# Patient Record
Sex: Male | Born: 1950 | Race: White | Hispanic: No | Marital: Single | Smoking: Never smoker
Health system: Southern US, Community
[De-identification: ages and names within clinical notes are randomized; demographics above are authoritative.]

## PROBLEM LIST (undated history)

## (undated) DIAGNOSIS — K5792 Diverticulitis of intestine, part unspecified, without perforation or abscess without bleeding: Secondary | ICD-10-CM

## (undated) DIAGNOSIS — N200 Calculus of kidney: Secondary | ICD-10-CM

## (undated) DIAGNOSIS — H269 Unspecified cataract: Secondary | ICD-10-CM

## (undated) DIAGNOSIS — J189 Pneumonia, unspecified organism: Secondary | ICD-10-CM

## (undated) DIAGNOSIS — M1990 Unspecified osteoarthritis, unspecified site: Secondary | ICD-10-CM

## (undated) DIAGNOSIS — J45909 Unspecified asthma, uncomplicated: Secondary | ICD-10-CM

## (undated) DIAGNOSIS — R011 Cardiac murmur, unspecified: Secondary | ICD-10-CM

## (undated) DIAGNOSIS — K219 Gastro-esophageal reflux disease without esophagitis: Secondary | ICD-10-CM

## (undated) DIAGNOSIS — F419 Anxiety disorder, unspecified: Secondary | ICD-10-CM

## (undated) HISTORY — PX: FOOT SURGERY: SHX648

## (undated) HISTORY — PX: BACK SURGERY: SHX140

## (undated) HISTORY — PX: CYSTOSCOPY W/ STONE MANIPULATION: SHX1427

## (undated) HISTORY — PX: CHOLECYSTECTOMY: SHX55

## (undated) HISTORY — DX: Cardiac murmur, unspecified: R01.1

## (undated) HISTORY — PX: OTHER SURGICAL HISTORY: SHX169

## (undated) HISTORY — DX: Unspecified cataract: H26.9

## (undated) HISTORY — PX: KNEE ARTHROSCOPY: SHX127

---

## 2015-12-07 ENCOUNTER — Other Ambulatory Visit: Payer: Self-pay | Admitting: Surgical

## 2016-01-04 NOTE — Patient Instructions (Signed)
Marcus Graves  01/04/2016   Your procedure is scheduled on: 01/12/16  Report to [facility] Main  Entrance take [place]  elevators to 3rd floor to  [place] at [place] AM.  Call this number if you have problems the morning of surgery [phone]   Remember: ONLY 1 PERSON MAY GO WITH YOU TO SHORT STAY TO GET  READY MORNING OF [place].  Do not eat food or drink liquids :After Midnight.     Take these medicines the morning of surgery with A SIP OF WATER: Levothyroxine, c elexa                                You may not have any metal on your body including hair pins and              piercings  Do not wear jewelry, make-up, lotions, powders or perfumes, deodorant             Do not wear nail polish.  Do not shave  48 hours prior to surgery.              Men may shave face and neck.   Do not bring valuables to the hospital. [place].  Contacts, dentures or bridgework may not be worn into surgery.  Leave suitcase in the car. After surgery it may be brought to your room.              [place] - Preparing for Surgery Before surgery, you can play an important role.  Because skin is not sterile, your skin needs to be as free of germs as possible.  You can reduce the number of germs on your skin by washing with CHG (chlorahexidine gluconate) soap before surgery.  CHG is an antiseptic cleaner which kills germs and bonds with the skin to continue killing germs even after washing. Please DO NOT use if you have an allergy to CHG or antibacterial soaps.  If your skin becomes reddened/irritated stop using the CHG and inform your nurse when you arrive at Short Stay. Do not shave (including legs and underarms) for at least 48 hours prior to the first CHG shower.  You may shave your face/neck. Please follow these instructions carefully:  1.  Shower with CHG Soap the night before surgery and the  morning of  Surgery.  2.  If you choose to wash your hair, wash your hair first as usual with your  normal  shampoo.  3.  After you shampoo, rinse your hair and body thoroughly to remove the  shampoo.                           4.  Use CHG as you would any other liquid soap.  You can apply chg directly  to the skin and wash                       Gently with a scrungie or clean washcloth.  5.  Apply the CHG Soap to your body ONLY FROM THE NECK DOWN.   Do not use on face/ open  Wound or open sores. Avoid contact with eyes, ears mouth and genitals (private parts).                       Wash face,  Genitals (private parts) with your normal soap.             6.  Wash thoroughly, paying special attention to the area where your surgery  will be performed.  7.  Thoroughly rinse your body with warm water from the neck down.  8.  DO NOT shower/wash with your normal soap after using and rinsing off  the CHG Soap.                9.  Pat yourself dry with a clean towel.            10.  Wear clean pajamas.            11.  Place clean sheets on your bed the night of your first shower and do not  sleep with pets. Day of Surgery : Do not apply any lotions/deodorants the morning of surgery.  Please wear clean clothes to the hospital/surgery center.  FAILURE TO FOLLOW THESE INSTRUCTIONS MAY RESULT IN THE CANCELLATION OF YOUR SURGERY PATIENT SIGNATURE_________________________________  NURSE SIGNATURE__________________________________  ________________________________________________________________________  WHAT IS A BLOOD TRANSFUSION? Blood Transfusion Information  A transfusion is the replacement of blood or some of its parts. Blood is made up of multiple cells which provide different functions.  Red blood cells carry oxygen and are used for blood loss replacement.  White blood cells fight against infection.  Platelets control bleeding.  Plasma helps clot blood.  Other blood products are  available for specialized needs, such as hemophilia or other clotting disorders. BEFORE THE TRANSFUSION  Who gives blood for transfusions?   Healthy volunteers who are fully evaluated to make sure their blood is safe. This is blood bank blood. Transfusion therapy is the safest it has ever been in the practice of medicine. Before blood is taken from a donor, a complete history is taken to make sure that person has no history of diseases nor engages in risky social behavior (examples are intravenous drug use or sexual activity with multiple partners). The donor's travel history is screened to minimize risk of transmitting infections, such as malaria. The donated blood is tested for signs of infectious diseases, such as HIV and hepatitis. The blood is then tested to be sure it is compatible with you in order to minimize the chance of a transfusion reaction. If you or a relative donates blood, this is often done in anticipation of surgery and is not appropriate for emergency situations. It takes many days to process the donated blood. RISKS AND COMPLICATIONS Although transfusion therapy is very safe and saves many lives, the main dangers of transfusion include:   Getting an infectious disease.  Developing a transfusion reaction. This is an allergic reaction to something in the blood you were given. Every precaution is taken to prevent this. The decision to have a blood transfusion has been considered carefully by your caregiver before blood is given. Blood is not given unless the benefits outweigh the risks. AFTER THE TRANSFUSION  Right after receiving a blood transfusion, you will usually feel much better and more energetic. This is especially true if your red blood cells have gotten low (anemic). The transfusion raises the level of the red blood cells which carry oxygen, and this usually causes an energy increase.  The  nurse administering the transfusion will monitor you carefully for  complications. HOME CARE INSTRUCTIONS  No special instructions are needed after a transfusion. You may find your energy is better. Speak with your caregiver about any limitations on activity for underlying diseases you may have. SEEK MEDICAL CARE IF:   Your condition is not improving after your transfusion.  You develop redness or irritation at the intravenous (IV) site. SEEK IMMEDIATE MEDICAL CARE IF:  Any of the following symptoms occur over the next 12 hours:  Shaking chills.  You have a temperature by mouth above 102 F (38.9 C), not controlled by medicine.  Chest, back, or muscle pain.  People around you feel you are not acting correctly or are confused.  Shortness of breath or difficulty breathing.  Dizziness and fainting.  You get a rash or develop hives.  You have a decrease in urine output.  Your urine turns a dark color or changes to pink, red, or brown. Any of the following symptoms occur over the next 10 days:  You have a temperature by mouth above 102 F (38.9 C), not controlled by medicine.  Shortness of breath.  Weakness after normal activity.  The white part of the eye turns yellow (jaundice).  You have a decrease in the amount of urine or are urinating less often.  Your urine turns a dark color or changes to pink, red, or brown. Document Released: 04/14/2000 Document Revised: 07/10/2011 Document Reviewed: 12/02/2007 ExitCare Patient Information 2014 [place].  _______________________________________________________________________  Incentive Spirometer  An incentive spirometer is a tool that can help keep your lungs clear and active. This tool measures how well you are filling your lungs with each breath. Taking long deep breaths may help reverse or decrease the chance of developing breathing (pulmonary) problems (especially infection) following:  A long period of time when you are unable to move or be active. BEFORE THE PROCEDURE   If  the spirometer includes an indicator to show your best effort, your nurse or respiratory therapist will set it to a desired goal.  If possible, sit up straight or lean slightly forward. Try not to slouch.  Hold the incentive spirometer in an upright position. INSTRUCTIONS FOR USE  1. Sit on the edge of your bed if possible, or sit up as far as you can in bed or on a chair. 2. Hold the incentive spirometer in an upright position. 3. Breathe out normally. 4. Place the mouthpiece in your mouth and seal your lips tightly around it. 5. Breathe in slowly and as deeply as possible, raising the piston or the ball toward the top of the column. 6. Hold your breath for 3-5 seconds or for as long as possible. Allow the piston or ball to fall to the bottom of the column. 7. Remove the mouthpiece from your mouth and breathe out normally. 8. Rest for a few seconds and repeat Steps 1 through 7 at least 10 times every 1-2 hours when you are awake. Take your time and take a few normal breaths between deep breaths. 9. The spirometer may include an indicator to show your best effort. Use the indicator as a goal to work toward during each repetition. 10. After each set of 10 deep breaths, practice coughing to be sure your lungs are clear. If you have an incision (the cut made at the time of surgery), support your incision when coughing by placing a pillow or rolled up towels firmly against it. Once you are able to get  out of bed, walk around indoors and cough well. You may stop using the incentive spirometer when instructed by your caregiver.  RISKS AND COMPLICATIONS  Take your time so you do not get dizzy or light-headed.  If you are in pain, you may need to take or ask for pain medication before doing incentive spirometry. It is harder to take a deep breath if you are having pain. AFTER USE  Rest and breathe slowly and easily.  It can be helpful to keep track of a log of your progress. Your caregiver can  provide you with a simple table to help with this. If you are using the spirometer at home, follow these instructions: [place] IF:   You are having difficultly using the spirometer.  You have trouble using the spirometer as often as instructed.  Your pain medication is not giving enough relief while using the spirometer.  You develop fever of 100.5 F (38.1 C) or higher. SEEK IMMEDIATE MEDICAL CARE IF:   You cough up bloody sputum that had not been present before.  You develop fever of 102 F (38.9 C) or greater.  You develop worsening pain at or near the incision site. MAKE SURE YOU:   Understand these instructions.  Will watch your condition.  Will get help right away if you are not doing well or get worse. Document Released: 08/28/2006 Document Revised: 07/10/2011 Document Reviewed: 10/29/2006 [facility] Patient Information 2014 [place], [place].   ________________________________________________________________________

## 2016-01-05 ENCOUNTER — Encounter (HOSPITAL_COMMUNITY)
Admission: RE | Admit: 2016-01-05 | Discharge: 2016-01-05 | Disposition: A | Discharge status: Home / Self Care | Payer: Medicare Other | Source: Ambulatory Visit | Attending: Orthopedic Surgery | Admitting: Orthopedic Surgery

## 2016-01-05 ENCOUNTER — Encounter (HOSPITAL_COMMUNITY): Payer: Self-pay

## 2016-01-05 ENCOUNTER — Ambulatory Visit (HOSPITAL_COMMUNITY)
Admission: RE | Admit: 2016-01-05 | Discharge: 2016-01-05 | Disposition: A | Discharge status: Home / Self Care | Payer: Medicare Other | Source: Ambulatory Visit | Attending: Surgical | Admitting: Surgical

## 2016-01-05 DIAGNOSIS — Z0189 Encounter for other specified special examinations: Secondary | ICD-10-CM

## 2016-01-05 DIAGNOSIS — I700 Atherosclerosis of aorta: Secondary | ICD-10-CM | POA: Diagnosis not present

## 2016-01-05 DIAGNOSIS — Z01818 Encounter for other preprocedural examination: Secondary | ICD-10-CM | POA: Insufficient documentation

## 2016-01-05 DIAGNOSIS — IMO0001 Reserved for inherently not codable concepts without codable children: Secondary | ICD-10-CM

## 2016-01-05 HISTORY — DX: Diverticulitis of intestine, part unspecified, without perforation or abscess without bleeding: K57.92

## 2016-01-05 HISTORY — DX: Unspecified asthma, uncomplicated: J45.909

## 2016-01-05 HISTORY — DX: Gastro-esophageal reflux disease without esophagitis: K21.9

## 2016-01-05 HISTORY — DX: Unspecified osteoarthritis, unspecified site: M19.90

## 2016-01-05 HISTORY — DX: Calculus of kidney: N20.0

## 2016-01-05 HISTORY — DX: Pneumonia, unspecified organism: J18.9

## 2016-01-05 HISTORY — DX: Anxiety disorder, unspecified: F41.9

## 2016-01-05 LAB — COMPREHENSIVE METABOLIC PANEL
ALT: 47 U/L (ref 17–63)
AST: 42 U/L — ABNORMAL HIGH (ref 15–41)
Albumin: 4.3 g/dL (ref 3.5–5.0)
Alkaline Phosphatase: 46 U/L (ref 38–126)
Anion gap: 7 (ref 5–15)
BUN: 13 mg/dL (ref 6–20)
CO2: 28 mmol/L (ref 22–32)
Calcium: 9.1 mg/dL (ref 8.9–10.3)
Chloride: 105 mmol/L (ref 101–111)
Creatinine, Ser: 0.88 mg/dL (ref 0.61–1.24)
GFR calc Af Amer: >60 mL/min (ref >60)
GFR calc non Af Amer: >60 mL/min (ref >60)
Glucose, Bld: 98 mg/dL (ref 65–99)
Potassium: 3.9 mmol/L (ref 3.5–5.1)
Sodium: 140 mmol/L (ref 135–145)
Total Bilirubin: 1.1 mg/dL (ref 0.3–1.2)
Total Protein: 7.2 g/dL (ref 6.5–8.1)

## 2016-01-05 LAB — SURGICAL PCR SCREEN
MRSA, PCR: NEGATIVE
STAPHYLOCOCCUS AUREUS: POSITIVE — AB

## 2016-01-05 LAB — URINALYSIS, ROUTINE W REFLEX MICROSCOPIC
Bilirubin Urine: NEGATIVE
Glucose, UA: NEGATIVE mg/dL
Hgb urine dipstick: NEGATIVE
Ketones, ur: NEGATIVE mg/dL
Leukocytes, UA: NEGATIVE
Nitrite: NEGATIVE
Protein, ur: NEGATIVE mg/dL
Specific Gravity, Urine: 1.024 (ref 1.005–1.030)
pH: 6 (ref 5.0–8.0)

## 2016-01-05 LAB — CBC WITH DIFFERENTIAL/PLATELET
Basophils Absolute: 0 10*3/uL (ref 0.0–0.1)
Basophils Relative: 1 %
Eosinophils Absolute: 0.2 10*3/uL (ref 0.0–0.7)
Eosinophils Relative: 4 %
HCT: 43 % (ref 39.0–52.0)
Hemoglobin: 15 g/dL (ref 13.0–17.0)
Lymphocytes Relative: 23 %
Lymphs Abs: 1.1 10*3/uL (ref 0.7–4.0)
MCH: 32.6 pg (ref 26.0–34.0)
MCHC: 34.9 g/dL (ref 30.0–36.0)
MCV: 93.5 fL (ref 78.0–100.0)
Monocytes Absolute: 0.5 10*3/uL (ref 0.1–1.0)
Monocytes Relative: 10 %
Neutro Abs: 3.1 10*3/uL (ref 1.7–7.7)
Neutrophils Relative %: 62 %
Platelets: 206 10*3/uL (ref 150–400)
RBC: 4.6 MIL/uL (ref 4.22–5.81)
RDW: 12.6 % (ref 11.5–15.5)
WBC: 4.9 10*3/uL (ref 4.0–10.5)

## 2016-01-05 LAB — APTT: aPTT: 32 seconds (ref 24–36)

## 2016-01-05 LAB — ABO/RH: ABO/RH(D): A POS

## 2016-01-05 LAB — PROTIME-INR
INR: 1.04
Prothrombin Time: 13.6 seconds (ref 11.4–15.2)

## 2016-01-10 NOTE — H&P (Signed)
TOTAL KNEE ADMISSION H&P  Patient is being admitted for right total knee arthroplasty.  Subjective:  Chief Complaint:right knee pain.  HPI: Marcus Graves, 65 y.o. male, has a history of pain and functional disability in the right knee due to arthritis and has failed non-surgical conservative treatments for greater than 12 weeks to includeNSAID's and/or analgesics, corticosteriod injections, flexibility and strengthening excercises and activity modification.  Onset of symptoms was gradual, starting 8 years ago with gradually worsening course since that time. The patient noted prior procedures on the knee to include  arthroscopy and menisectomy on the right knee(s).  Patient currently rates pain in the right knee(s) at 8 out of 10 with activity. Patient has night pain, worsening of pain with activity and weight bearing, pain that interferes with activities of daily living, pain with passive range of motion, crepitus and joint swelling.  Patient has evidence of periarticular osteophytes and joint space narrowing by imaging studies. There is no active infection.   Past Medical History:  Diagnosis Date  . Anxiety   . Arthritis   . Asthma   . Diverticulitis   . GERD (gastroesophageal reflux disease)   . Kidney stone   . Pneumonia     Past Surgical History:  Procedure Laterality Date  . BACK SURGERY     lumbar  . CHOLECYSTECTOMY    . CYSTOSCOPY W/ STONE MANIPULATION    . esophageal dilitation    . FOOT SURGERY Right   . KNEE ARTHROSCOPY     2 right, 1 left      Current Outpatient Prescriptions:  .  albuterol (PROVENTIL HFA;VENTOLIN HFA) 108 (90 Base) MCG/ACT inhaler, Inhale 2 puffs into the lungs every 4 (four) hours as needed for wheezing or shortness of breath. , Disp: , Rfl:  .  ALPRAZolam (XANAX) 0.5 MG tablet, Take 0.5 mg by mouth at bedtime., Disp: , Rfl:  .  aspirin 325 MG tablet, Take 325 mg by mouth daily., Disp: , Rfl:  .  citalopram (CELEXA) 10 MG tablet, Take 10 mg by  mouth daily., Disp: , Rfl:  .  levothyroxine (SYNTHROID, LEVOTHROID) 50 MCG tablet, Take 50 mcg by mouth daily before breakfast. , Disp: , Rfl: 0  No Known Allergies  Social History  Substance Use Topics  . Smoking status: Never Smoker  . Smokeless tobacco: Current User    Types: Chew  . Alcohol use No      Review of Systems  Constitutional: Negative.   HENT: Positive for tinnitus. Negative for congestion, ear discharge, ear pain, hearing loss, nosebleeds and sore throat.   Eyes: Negative.   Respiratory: Positive for cough. Negative for hemoptysis, sputum production, shortness of breath, wheezing and stridor.   Cardiovascular: Negative.   Gastrointestinal: Negative.   Genitourinary: Negative.   Musculoskeletal: Positive for joint pain and myalgias. Negative for back pain, falls and neck pain.  Skin: Negative.   Neurological: Negative.  Negative for headaches.  Endo/Heme/Allergies: Negative.   Psychiatric/Behavioral: Negative.     Objective:  Physical Exam  Constitutional: He is oriented to person, place, and time. He appears well-developed. No distress.  Obese  HENT:  Head: Normocephalic and atraumatic.  Right Ear: External ear normal.  Left Ear: External ear normal.  Nose: Nose normal.  Mouth/Throat: Oropharynx is clear and moist.  Eyes: Conjunctivae and EOM are normal.  Neck: Normal range of motion. Neck supple.  Cardiovascular: Normal rate, regular rhythm and intact distal pulses.   Murmur heard.  Systolic murmur is present  with a grade of 2/6  Respiratory: Effort normal and breath sounds normal. No respiratory distress. He has no wheezes.  GI: Soft. Bowel sounds are normal. He exhibits no distension. There is no tenderness.  Musculoskeletal:       Right hip: Normal.       Left hip: Normal.       Right knee: He exhibits decreased range of motion and swelling. He exhibits no effusion and no erythema. Tenderness found. Medial joint line and lateral joint line  tenderness noted.       Left knee: Normal.  Neurological: He is alert and oriented to person, place, and time. He has normal strength. No sensory deficit.  Skin: No rash noted. He is not diaphoretic. No erythema.  Psychiatric: He has a normal mood and affect. His behavior is normal.    Vitals Weight: 260 lb Height: 72in Body Surface Area: 2.38 m Body Mass Index: 35.26 kg/m  Pulse: 72 (Regular)  BP: 140/76 (Sitting, Left Arm, Standard)  Imaging Review Plain radiographs demonstrate severe degenerative joint disease of the right knee(s). The overall alignment ismild varus. The bone quality appears to be good for age and reported activity level.  Assessment/Plan:  End stage primary osteoarthritis, right knee   The patient history, physical examination, clinical judgment of the provider and imaging studies are consistent with end stage degenerative joint disease of the right knee(s) and total knee arthroplasty is deemed medically necessary. The treatment options including medical management, injection therapy arthroscopy and arthroplasty were discussed at length. The risks and benefits of total knee arthroplasty were presented and reviewed. The risks due to aseptic loosening, infection, stiffness, patella tracking problems, thromboembolic complications and other imponderables were discussed. The patient acknowledged the explanation, agreed to proceed with the plan and consent was signed. Patient is being admitted for inpatient treatment for surgery, pain control, PT, OT, prophylactic antibiotics, VTE prophylaxis, progressive ambulation and ADL's and discharge planning. The patient is planning to be discharged home.  PCP: Dr. Cecille Amsterdam Therapy Plans: directly to outpatient at [facility] starting 9/18 Home with daughter and son  Ardeen Jourdain, [place]

## 2016-01-11 MED ORDER — DEXTROSE 5 % IV SOLN
3.0000 g | INTRAVENOUS | Status: AC
  Administered 2016-01-12: 3 g via INTRAVENOUS
  Filled 2016-01-11 (×2): qty 3000

## 2016-01-12 ENCOUNTER — Encounter (HOSPITAL_COMMUNITY)
Admission: RE | Disposition: A | Discharge status: Home Health | Payer: Self-pay | Source: Ambulatory Visit | Attending: Orthopedic Surgery

## 2016-01-12 ENCOUNTER — Inpatient Hospital Stay (HOSPITAL_COMMUNITY): Payer: Medicare Other | Admitting: Anesthesiology

## 2016-01-12 ENCOUNTER — Encounter (HOSPITAL_COMMUNITY): Payer: Self-pay | Admitting: *Deleted

## 2016-01-12 ENCOUNTER — Inpatient Hospital Stay (HOSPITAL_COMMUNITY)
Admission: RE | Admit: 2016-01-12 | Discharge: 2016-01-14 | DRG: 470 | Disposition: A | Discharge status: Home Health | Payer: Medicare Other | Source: Ambulatory Visit | Attending: Orthopedic Surgery | Admitting: Orthopedic Surgery

## 2016-01-12 DIAGNOSIS — M25561 Pain in right knee: Secondary | ICD-10-CM | POA: Diagnosis present

## 2016-01-12 DIAGNOSIS — J45909 Unspecified asthma, uncomplicated: Secondary | ICD-10-CM | POA: Diagnosis present

## 2016-01-12 DIAGNOSIS — F419 Anxiety disorder, unspecified: Secondary | ICD-10-CM | POA: Diagnosis present

## 2016-01-12 DIAGNOSIS — K219 Gastro-esophageal reflux disease without esophagitis: Secondary | ICD-10-CM | POA: Diagnosis present

## 2016-01-12 DIAGNOSIS — Z79899 Other long term (current) drug therapy: Secondary | ICD-10-CM

## 2016-01-12 DIAGNOSIS — Z96659 Presence of unspecified artificial knee joint: Secondary | ICD-10-CM

## 2016-01-12 DIAGNOSIS — M1711 Unilateral primary osteoarthritis, right knee: Secondary | ICD-10-CM | POA: Diagnosis present

## 2016-01-12 HISTORY — PX: TOTAL KNEE ARTHROPLASTY: SHX125

## 2016-01-12 LAB — TYPE AND SCREEN
ABO/RH(D): A POS
Antibody Screen: NEGATIVE

## 2016-01-12 SURGERY — ARTHROPLASTY, KNEE, TOTAL
Anesthesia: General | Site: Knee | Laterality: Right

## 2016-01-12 MED ORDER — BUPIVACAINE LIPOSOME 1.3 % IJ SUSP
INTRAMUSCULAR | Status: DC | PRN
  Administered 2016-01-12: 20 mL

## 2016-01-12 MED ORDER — BUPIVACAINE LIPOSOME 1.3 % IJ SUSP
20.0000 mL | Freq: Once | INTRAMUSCULAR | Status: DC
  Filled 2016-01-12: qty 20

## 2016-01-12 MED ORDER — ONDANSETRON HCL 4 MG/2ML IJ SOLN: 4.0000 mg | Freq: Four times a day (QID) | INTRAMUSCULAR | Status: DC | PRN

## 2016-01-12 MED ORDER — SUCCINYLCHOLINE CHLORIDE 200 MG/10ML IV SOSY
PREFILLED_SYRINGE | INTRAVENOUS | Status: DC | PRN
  Administered 2016-01-12: 140 mg via INTRAVENOUS

## 2016-01-12 MED ORDER — CHLORHEXIDINE GLUCONATE 4 % EX LIQD
60.0000 mL | Freq: Once | CUTANEOUS | Status: DC
Start: 2016-01-12 — End: 2016-01-12

## 2016-01-12 MED ORDER — POLYMYXIN B SULFATE 500000 UNITS IJ SOLR
INTRAMUSCULAR | Status: AC
  Filled 2016-01-12: qty 1

## 2016-01-12 MED ORDER — HYDROCODONE-ACETAMINOPHEN 5-325 MG PO TABS
ORAL_TABLET | ORAL | Status: AC
  Filled 2016-01-12: qty 1

## 2016-01-12 MED ORDER — TRANEXAMIC ACID 1000 MG/10ML IV SOLN
1000.0000 mg | INTRAVENOUS | Status: AC
  Administered 2016-01-12: 1000 mg via INTRAVENOUS
  Filled 2016-01-12: qty 1100

## 2016-01-12 MED ORDER — POLYETHYLENE GLYCOL 3350 17 G PO PACK: 17.0000 g | PACK | Freq: Every day | ORAL | Status: DC | PRN

## 2016-01-12 MED ORDER — HYDROMORPHONE HCL 1 MG/ML IJ SOLN
INTRAMUSCULAR | Status: AC
  Filled 2016-01-12: qty 1

## 2016-01-12 MED ORDER — LIDOCAINE 2% (20 MG/ML) 5 ML SYRINGE
INTRAMUSCULAR | Status: AC
  Filled 2016-01-12: qty 5

## 2016-01-12 MED ORDER — LACTATED RINGERS IV SOLN
INTRAVENOUS | Status: DC
  Administered 2016-01-12 (×2): via INTRAVENOUS

## 2016-01-12 MED ORDER — RIVAROXABAN 10 MG PO TABS
10.0000 mg | ORAL_TABLET | Freq: Every day | ORAL | Status: DC
  Administered 2016-01-13 – 2016-01-14 (×2): 10 mg via ORAL
  Filled 2016-01-12 (×2): qty 1

## 2016-01-12 MED ORDER — STERILE WATER FOR IRRIGATION IR SOLN
Status: DC | PRN
  Administered 2016-01-12: 2000 mL

## 2016-01-12 MED ORDER — CELECOXIB 200 MG PO CAPS
200.0000 mg | ORAL_CAPSULE | Freq: Two times a day (BID) | ORAL | Status: DC
  Administered 2016-01-12 – 2016-01-14 (×4): 200 mg via ORAL
  Filled 2016-01-12 (×4): qty 1

## 2016-01-12 MED ORDER — SODIUM CHLORIDE 0.9 % IJ SOLN
INTRAMUSCULAR | Status: DC | PRN
  Administered 2016-01-12: 20 mL

## 2016-01-12 MED ORDER — METHOCARBAMOL 1000 MG/10ML IJ SOLN
500.0000 mg | Freq: Four times a day (QID) | INTRAVENOUS | Status: DC | PRN
  Administered 2016-01-12: 500 mg via INTRAVENOUS
  Filled 2016-01-12: qty 550
  Filled 2016-01-12: qty 5

## 2016-01-12 MED ORDER — SODIUM CHLORIDE 0.9 % IJ SOLN
INTRAMUSCULAR | Status: AC
  Filled 2016-01-12: qty 50

## 2016-01-12 MED ORDER — CITALOPRAM HYDROBROMIDE 20 MG PO TABS
10.0000 mg | ORAL_TABLET | Freq: Every day | ORAL | Status: DC
  Administered 2016-01-13 – 2016-01-14 (×2): 10 mg via ORAL
  Filled 2016-01-12 (×2): qty 1

## 2016-01-12 MED ORDER — BISACODYL 5 MG PO TBEC: 5.0000 mg | DELAYED_RELEASE_TABLET | Freq: Every day | ORAL | Status: DC | PRN

## 2016-01-12 MED ORDER — SUGAMMADEX SODIUM 500 MG/5ML IV SOLN
INTRAVENOUS | Status: DC | PRN
  Administered 2016-01-12: 400 mg via INTRAVENOUS

## 2016-01-12 MED ORDER — ACETAMINOPHEN 650 MG RE SUPP: 650.0000 mg | Freq: Four times a day (QID) | RECTAL | Status: DC | PRN

## 2016-01-12 MED ORDER — ONDANSETRON HCL 4 MG/2ML IJ SOLN: 4.0000 mg | Freq: Once | INTRAMUSCULAR | Status: DC | PRN

## 2016-01-12 MED ORDER — PROPOFOL 10 MG/ML IV BOLUS
INTRAVENOUS | Status: AC
  Filled 2016-01-12: qty 20

## 2016-01-12 MED ORDER — FENTANYL CITRATE (PF) 100 MCG/2ML IJ SOLN
INTRAMUSCULAR | Status: AC
  Filled 2016-01-12: qty 2

## 2016-01-12 MED ORDER — FLEET ENEMA 7-19 GM/118ML RE ENEM: 1.0000 | ENEMA | Freq: Once | RECTAL | Status: DC | PRN

## 2016-01-12 MED ORDER — BUPIVACAINE-EPINEPHRINE (PF) 0.5% -1:200000 IJ SOLN
INTRAMUSCULAR | Status: DC | PRN
  Administered 2016-01-12: 30 mL via PERINEURAL

## 2016-01-12 MED ORDER — MENTHOL 3 MG MT LOZG: 1.0000 | LOZENGE | OROMUCOSAL | Status: DC | PRN

## 2016-01-12 MED ORDER — LIDOCAINE 2% (20 MG/ML) 5 ML SYRINGE
INTRAMUSCULAR | Status: DC | PRN
  Administered 2016-01-12: 100 mg via INTRAVENOUS

## 2016-01-12 MED ORDER — CEFAZOLIN IN D5W 1 GM/50ML IV SOLN
1.0000 g | Freq: Four times a day (QID) | INTRAVENOUS | Status: AC
  Administered 2016-01-12 (×2): 1 g via INTRAVENOUS
  Filled 2016-01-12 (×4): qty 50

## 2016-01-12 MED ORDER — FENTANYL CITRATE (PF) 100 MCG/2ML IJ SOLN
INTRAMUSCULAR | Status: DC | PRN
  Administered 2016-01-12: 100 ug via INTRAVENOUS
  Administered 2016-01-12: 50 ug via INTRAVENOUS

## 2016-01-12 MED ORDER — BUPIVACAINE HCL (PF) 0.25 % IJ SOLN
INTRAMUSCULAR | Status: AC
  Filled 2016-01-12: qty 30

## 2016-01-12 MED ORDER — CHLORHEXIDINE GLUCONATE 4 % EX LIQD: 60.0000 mL | Freq: Once | CUTANEOUS | Status: DC

## 2016-01-12 MED ORDER — SODIUM CHLORIDE 0.9 % IR SOLN
Status: DC | PRN
  Administered 2016-01-12: 1000 mL

## 2016-01-12 MED ORDER — ROCURONIUM BROMIDE 10 MG/ML (PF) SYRINGE
PREFILLED_SYRINGE | INTRAVENOUS | Status: DC | PRN
  Administered 2016-01-12: 20 mg via INTRAVENOUS
  Administered 2016-01-12: 40 mg via INTRAVENOUS
  Administered 2016-01-12: 20 mg via INTRAVENOUS

## 2016-01-12 MED ORDER — MIDAZOLAM HCL 2 MG/2ML IJ SOLN
INTRAMUSCULAR | Status: AC
  Filled 2016-01-12: qty 2

## 2016-01-12 MED ORDER — LEVOTHYROXINE SODIUM 50 MCG PO TABS
50.0000 ug | ORAL_TABLET | Freq: Every day | ORAL | Status: DC
  Administered 2016-01-13 – 2016-01-14 (×2): 50 ug via ORAL
  Filled 2016-01-12 (×2): qty 1

## 2016-01-12 MED ORDER — FERROUS SULFATE 325 (65 FE) MG PO TABS
325.0000 mg | ORAL_TABLET | Freq: Three times a day (TID) | ORAL | Status: DC
  Administered 2016-01-13 – 2016-01-14 (×2): 325 mg via ORAL
  Filled 2016-01-12 (×2): qty 1

## 2016-01-12 MED ORDER — ONDANSETRON HCL 4 MG PO TABS: 4.0000 mg | ORAL_TABLET | Freq: Four times a day (QID) | ORAL | Status: DC | PRN

## 2016-01-12 MED ORDER — ACETAMINOPHEN 325 MG PO TABS: 650.0000 mg | ORAL_TABLET | Freq: Four times a day (QID) | ORAL | Status: DC | PRN

## 2016-01-12 MED ORDER — ONDANSETRON HCL 4 MG/2ML IJ SOLN
INTRAMUSCULAR | Status: DC | PRN
  Administered 2016-01-12: 4 mg via INTRAVENOUS

## 2016-01-12 MED ORDER — METHOCARBAMOL 500 MG PO TABS
500.0000 mg | ORAL_TABLET | Freq: Four times a day (QID) | ORAL | Status: DC | PRN
  Administered 2016-01-12 – 2016-01-14 (×4): 500 mg via ORAL
  Filled 2016-01-12 (×4): qty 1

## 2016-01-12 MED ORDER — ROCURONIUM BROMIDE 10 MG/ML (PF) SYRINGE
PREFILLED_SYRINGE | INTRAVENOUS | Status: AC
  Filled 2016-01-12: qty 10

## 2016-01-12 MED ORDER — OXYCODONE-ACETAMINOPHEN 5-325 MG PO TABS
2.0000 | ORAL_TABLET | ORAL | Status: DC | PRN
  Administered 2016-01-12 – 2016-01-13 (×5): 2 via ORAL
  Administered 2016-01-13 (×2): 1 via ORAL
  Administered 2016-01-14 (×2): 2 via ORAL
  Administered 2016-01-14: 1 via ORAL
  Filled 2016-01-12 (×9): qty 2

## 2016-01-12 MED ORDER — ALBUTEROL SULFATE (2.5 MG/3ML) 0.083% IN NEBU: 3.0000 mL | INHALATION_SOLUTION | RESPIRATORY_TRACT | Status: DC | PRN

## 2016-01-12 MED ORDER — HYDROMORPHONE HCL 1 MG/ML IJ SOLN
1.0000 mg | INTRAMUSCULAR | Status: DC | PRN
  Administered 2016-01-12: 1 mg via INTRAVENOUS
  Filled 2016-01-12: qty 1

## 2016-01-12 MED ORDER — HEMOSTATIC AGENTS (NO CHARGE) OPTIME
TOPICAL | Status: DC | PRN
  Administered 2016-01-12: 1 via TOPICAL

## 2016-01-12 MED ORDER — SUGAMMADEX SODIUM 500 MG/5ML IV SOLN
INTRAVENOUS | Status: AC
  Filled 2016-01-12: qty 5

## 2016-01-12 MED ORDER — PROPOFOL 10 MG/ML IV BOLUS
INTRAVENOUS | Status: DC | PRN
  Administered 2016-01-12: 250 mg via INTRAVENOUS

## 2016-01-12 MED ORDER — BUPIVACAINE-EPINEPHRINE 0.5% -1:200000 IJ SOLN
INTRAMUSCULAR | Status: AC
  Filled 2016-01-12: qty 1

## 2016-01-12 MED ORDER — ONDANSETRON HCL 4 MG/2ML IJ SOLN
INTRAMUSCULAR | Status: AC
  Filled 2016-01-12: qty 2

## 2016-01-12 MED ORDER — LACTATED RINGERS IV SOLN
INTRAVENOUS | Status: DC
  Administered 2016-01-12: 13:00:00 via INTRAVENOUS

## 2016-01-12 MED ORDER — MIDAZOLAM HCL 5 MG/5ML IJ SOLN
INTRAMUSCULAR | Status: DC | PRN
  Administered 2016-01-12: 2 mg via INTRAVENOUS

## 2016-01-12 MED ORDER — DEXAMETHASONE SODIUM PHOSPHATE 10 MG/ML IJ SOLN
INTRAMUSCULAR | Status: DC | PRN
  Administered 2016-01-12: 10 mg via INTRAVENOUS

## 2016-01-12 MED ORDER — MEPERIDINE HCL 50 MG/ML IJ SOLN: 6.2500 mg | INTRAMUSCULAR | Status: DC | PRN

## 2016-01-12 MED ORDER — HYDROCODONE-ACETAMINOPHEN 5-325 MG PO TABS
1.0000 | ORAL_TABLET | ORAL | Status: DC | PRN
  Administered 2016-01-12: 1 via ORAL
  Filled 2016-01-12: qty 1

## 2016-01-12 MED ORDER — ALPRAZOLAM 0.5 MG PO TABS
0.5000 mg | ORAL_TABLET | Freq: Every day | ORAL | Status: DC
  Administered 2016-01-12 – 2016-01-13 (×2): 0.5 mg via ORAL
  Filled 2016-01-12 (×2): qty 1

## 2016-01-12 MED ORDER — POLYMYXIN B SULFATE 500000 UNITS IJ SOLR
INTRAMUSCULAR | Status: DC | PRN
  Administered 2016-01-12: 500 mL

## 2016-01-12 MED ORDER — HYDROMORPHONE HCL 1 MG/ML IJ SOLN
0.2500 mg | INTRAMUSCULAR | Status: DC | PRN
  Administered 2016-01-12 (×2): 0.25 mg via INTRAVENOUS

## 2016-01-12 MED ORDER — ALUM & MAG HYDROXIDE-SIMETH 200-200-20 MG/5ML PO SUSP
30.0000 mL | ORAL | Status: DC | PRN
  Administered 2016-01-13 – 2016-01-14 (×3): 30 mL via ORAL
  Filled 2016-01-12 (×3): qty 30

## 2016-01-12 MED ORDER — PHENOL 1.4 % MT LIQD: 1.0000 | OROMUCOSAL | Status: DC | PRN

## 2016-01-12 MED ORDER — DEXAMETHASONE SODIUM PHOSPHATE 10 MG/ML IJ SOLN
INTRAMUSCULAR | Status: AC
  Filled 2016-01-12: qty 1

## 2016-01-12 SURGICAL SUPPLY — 67 items
BAG ZIPLOCK 12X15 (MISCELLANEOUS) ×3 IMPLANT
BANDAGE ACE 4X5 VEL STRL LF (GAUZE/BANDAGES/DRESSINGS) ×3 IMPLANT
BANDAGE ACE 6X5 VEL STRL LF (GAUZE/BANDAGES/DRESSINGS) ×3 IMPLANT
BLADE SAG 18X100X1.27 (BLADE) ×3 IMPLANT
BLADE SAW SGTL 11.0X1.19X90.0M (BLADE) ×3 IMPLANT
BNDG COHESIVE 4X5 TAN NS LF (GAUZE/BANDAGES/DRESSINGS) ×6 IMPLANT
BONE CEMENT GENTAMICIN (Cement) ×6 IMPLANT
CAP KNEE TOTAL 3 SIGMA ×3 IMPLANT
CEMENT BONE GENTAMICIN 40 (Cement) ×2 IMPLANT
CLOSURE WOUND 1/2 X4 (GAUZE/BANDAGES/DRESSINGS) ×1
CLOTH BEACON ORANGE TIMEOUT ST (SAFETY) ×3 IMPLANT
CUFF TOURN SGL QUICK 34 (TOURNIQUET CUFF) ×2
CUFF TRNQT CYL 34X4X40X1 (TOURNIQUET CUFF) ×1 IMPLANT
DECANTER SPIKE VIAL GLASS SM (MISCELLANEOUS) ×3 IMPLANT
DRAPE INCISE IOBAN 66X45 STRL (DRAPES) ×3 IMPLANT
DRAPE U-SHAPE 47X51 STRL (DRAPES) ×3 IMPLANT
DRSG ADAPTIC 3X8 NADH LF (GAUZE/BANDAGES/DRESSINGS) ×3 IMPLANT
DRSG KUZMA FLUFF (GAUZE/BANDAGES/DRESSINGS) ×3 IMPLANT
DRSG PAD ABDOMINAL 8X10 ST (GAUZE/BANDAGES/DRESSINGS) ×6 IMPLANT
DURAPREP 26ML APPLICATOR (WOUND CARE) ×3 IMPLANT
ELECT REM PT RETURN 9FT ADLT (ELECTROSURGICAL) ×3
ELECTRODE REM PT RTRN 9FT ADLT (ELECTROSURGICAL) ×1 IMPLANT
EVACUATOR 1/8 PVC DRAIN (DRAIN) ×3 IMPLANT
FACESHIELD WRAPAROUND (MASK) ×3 IMPLANT
GAUZE SPONGE 4X4 12PLY STRL (GAUZE/BANDAGES/DRESSINGS) ×3 IMPLANT
GLOVE BIO SURGEON STRL SZ 6.5 (GLOVE) ×2 IMPLANT
GLOVE BIO SURGEONS STRL SZ 6.5 (GLOVE) ×1
GLOVE BIOGEL PI IND STRL 6.5 (GLOVE) ×2 IMPLANT
GLOVE BIOGEL PI IND STRL 7.0 (GLOVE) ×2 IMPLANT
GLOVE BIOGEL PI IND STRL 7.5 (GLOVE) ×3 IMPLANT
GLOVE BIOGEL PI IND STRL 8 (GLOVE) ×1 IMPLANT
GLOVE BIOGEL PI INDICATOR 6.5 (GLOVE) ×4
GLOVE BIOGEL PI INDICATOR 7.0 (GLOVE) ×4
GLOVE BIOGEL PI INDICATOR 7.5 (GLOVE) ×6
GLOVE BIOGEL PI INDICATOR 8 (GLOVE) ×2
GLOVE ECLIPSE 8.0 STRL XLNG CF (GLOVE) ×6 IMPLANT
GLOVE SURG SS PI 6.5 STRL IVOR (GLOVE) ×3 IMPLANT
GOWN STRL REUS W/TWL LRG LVL3 (GOWN DISPOSABLE) ×6 IMPLANT
GOWN STRL REUS W/TWL XL LVL3 (GOWN DISPOSABLE) ×6 IMPLANT
HANDPIECE INTERPULSE COAX TIP (DISPOSABLE) ×2
HEMOSTAT SPONGE AVITENE ULTRA (HEMOSTASIS) ×3 IMPLANT
IMMOBILIZER KNEE 20 (SOFTGOODS) ×3
IMMOBILIZER KNEE 20 THIGH 36 (SOFTGOODS) ×1 IMPLANT
MANIFOLD NEPTUNE II (INSTRUMENTS) ×3 IMPLANT
NEEDLE HYPO 21X1.5 SAFETY (NEEDLE) ×3 IMPLANT
NEEDLE HYPO 22GX1.5 SAFETY (NEEDLE) ×3 IMPLANT
PACK TOTAL KNEE CUSTOM (KITS) ×3 IMPLANT
PAD ABD 8X10 STRL (GAUZE/BANDAGES/DRESSINGS) ×3 IMPLANT
PENCIL SMOKE EVAC W/HOLSTER (ELECTROSURGICAL) ×3 IMPLANT
POSITIONER SURGICAL ARM (MISCELLANEOUS) ×3 IMPLANT
SET HNDPC FAN SPRY TIP SCT (DISPOSABLE) ×1 IMPLANT
SET PAD KNEE POSITIONER (MISCELLANEOUS) ×3 IMPLANT
STRIP CLOSURE SKIN 1/2X4 (GAUZE/BANDAGES/DRESSINGS) ×2 IMPLANT
SUT BONE WAX W31G (SUTURE) IMPLANT
SUT MNCRL AB 4-0 PS2 18 (SUTURE) ×3 IMPLANT
SUT VIC AB 1 CT1 27 (SUTURE) ×4
SUT VIC AB 1 CT1 27XBRD ANTBC (SUTURE) ×2 IMPLANT
SUT VIC AB 1 CT1 36 (SUTURE) ×3 IMPLANT
SUT VIC AB 2-0 CT1 27 (SUTURE) ×6
SUT VIC AB 2-0 CT1 TAPERPNT 27 (SUTURE) ×3 IMPLANT
SUT VLOC 180 0 24IN GS25 (SUTURE) ×3 IMPLANT
SYR 20CC LL (SYRINGE) ×6 IMPLANT
TOWER CARTRIDGE SMART MIX (DISPOSABLE) ×3 IMPLANT
TRAY FOLEY CATH SILVER 14FR (SET/KITS/TRAYS/PACK) ×3 IMPLANT
TRAY FOLEY W/METER SILVER 16FR (SET/KITS/TRAYS/PACK) IMPLANT
WRAP KNEE MAXI GEL POST OP (GAUZE/BANDAGES/DRESSINGS) ×3 IMPLANT
YANKAUER SUCT BULB TIP 10FT TU (MISCELLANEOUS) ×3 IMPLANT

## 2016-01-12 NOTE — Brief Op Note (Signed)
01/12/2016  10:35 AM  PATIENT:  Marcus Graves  65 y.o. male  PRE-OPERATIVE DIAGNOSIS:  right knee Primary osteoarthritis  POST-OPERATIVE DIAGNOSIS:  right knee Primary osteoarthritis  PROCEDURE:  Procedure(s) with comments: RIGHT TOTAL KNEE ARTHROPLASTY (Right) - Adductor Block  SURGEON:  Surgeon(s) and Role:    * Latanya Maudlin, MD - Primary  PHYSICIAN ASSISTANT: Ardeen Jourdain PA  ASSISTANTS: Ardeen Jourdain PA  ANESTHESIA:   general  EBL:  Total I/O In: 1000 [I.V.:1000] Out: 180 [Urine:150; Blood:30]  BLOOD ADMINISTERED:none  DRAINS: (One) Hemovact drain(s) in the Right Knee with  Suction Open   LOCAL MEDICATIONS USED:  Exparel 20cc mixed with 20cc of Normal Saline   SPECIMEN:  No Specimen  DISPOSITION OF SPECIMEN:  N/A  COUNTS:  YES  TOURNIQUET:  * Missing tourniquet times found for documented tourniquets in log:  [id number] *  DICTATION: .Other Dictation: Dictation Number [id number]  PLAN OF CARE: Admit to inpatient   PATIENT DISPOSITION:  Stable in OR   Delay start of Pharmacological VTE agent (>24hrs) due to surgical blood loss or risk of bleeding: yes

## 2016-01-12 NOTE — Anesthesia Procedure Notes (Signed)
Procedure Name: Intubation Date/Time: 01/12/2016 8:29 AM Performed by: Lind Covert Pre-anesthesia Checklist: Patient identified, Emergency Drugs available, Suction available, Patient being monitored and Timeout performed Patient Re-evaluated:Patient Re-evaluated prior to inductionOxygen Delivery Method: Circle system utilized Preoxygenation: Pre-oxygenation with 100% oxygen Intubation Type: IV induction Laryngoscope Size: Mac and 4 Grade View: Grade I Tube type: Oral Tube size: 7.5 mm Number of attempts: 1 Airway Equipment and Method: Stylet Placement Confirmation: ETT inserted through vocal cords under direct vision,  breath sounds checked- equal and bilateral and positive ETCO2 Secured at: 23 cm Tube secured with: Tape Dental Injury: Teeth and Oropharynx as per pre-operative assessment

## 2016-01-12 NOTE — Anesthesia Preprocedure Evaluation (Signed)
Anesthesia Evaluation  Patient identified by MRN, date of birth, ID band Patient awake    Reviewed: Allergy & Precautions, NPO status , Patient's Chart, lab work & pertinent test results  Airway Mallampati: I  TM Distance: >3 FB Neck ROM: Full    Dental   Pulmonary    Pulmonary exam normal        Cardiovascular Normal cardiovascular exam     Neuro/Psych Anxiety    GI/Hepatic GERD  Medicated and Controlled,  Endo/Other    Renal/GU      Musculoskeletal   Abdominal   Peds  Hematology   Anesthesia Other Findings   Reproductive/Obstetrics                             Anesthesia Physical Anesthesia Plan  ASA: II  Anesthesia Plan: General   Post-op Pain Management:  Regional for Post-op pain   Induction: Intravenous  Airway Management Planned: LMA  Additional Equipment:   Intra-op Plan:   Post-operative Plan: Extubation in OR  Informed Consent: I have reviewed the patients History and Physical, chart, labs and discussed the procedure including the risks, benefits and alternatives for the proposed anesthesia with the patient or authorized representative who has indicated his/her understanding and acceptance.     Plan Discussed with: CRNA and Surgeon  Anesthesia Plan Comments:         Anesthesia Quick Evaluation

## 2016-01-12 NOTE — Transfer of Care (Signed)
Immediate Anesthesia Transfer of Care Note  Patient: Marcus Graves  Procedure(s) Performed: Procedure(s) with comments: RIGHT TOTAL KNEE ARTHROPLASTY (Right) - Adductor Block  Patient Location: PACU  Anesthesia Type:General and Regional  Level of Consciousness: sedated  Airway & Oxygen Therapy: Patient Spontanous Breathing and Patient connected to face mask oxygen  Post-op Assessment: Report given to RN and Post -op Vital signs reviewed and stable  Post vital signs: Reviewed and stable  Last Vitals:  Vitals:   01/12/16 0650  BP: 135/76  Pulse: 64  Resp: 16  Temp: 36.6 C    Last Pain:  Vitals:   01/12/16 0650  TempSrc: Oral      Patients Stated Pain Goal: 4 ([id number])  Complications: No apparent anesthesia complications

## 2016-01-12 NOTE — Anesthesia Procedure Notes (Signed)
Anesthesia Regional Block:  Adductor canal block  Pre-Anesthetic Checklist: ,, timeout performed, Correct Patient, Correct Site, Correct Laterality, Correct Procedure, Correct Position, site marked, Risks and benefits discussed,  Surgical consent,  Pre-op evaluation,  At surgeon's request and post-op pain management  Laterality: Right  Prep: chloraprep       Needles:  Injection technique: Single-shot  Needle Type: Echogenic Stimulator Needle     Needle Length: 9cm 9 cm Needle Gauge: 21 and 21 G    Additional Needles:  Procedures: ultrasound guided (picture in chart) Adductor canal block Narrative:  Start time: 01/12/2016 7:10 AM End time: 01/12/2016 7:20 AM Injection made incrementally with aspirations every 5 mL.  Performed by: Personally  Anesthesiologist: Lillia Abed  Additional Notes: Monitors applied. Patient sedated. Sterile prep and drape,hand hygiene and sterile gloves were used. Relevant anatomy identified.Needle position confirmed.Local anesthetic injected incrementally after negative aspiration. Local anesthetic spread visualized around nerve(s). Vascular puncture avoided. No complications. Image printed for medical record.The patient tolerated the procedure well.    Lillia Abed MD

## 2016-01-12 NOTE — Anesthesia Postprocedure Evaluation (Signed)
Anesthesia Post Note  Patient: Marcus Graves  Procedure(s) Performed: Procedure(s) (LRB): RIGHT TOTAL KNEE ARTHROPLASTY (Right)  Patient location during evaluation: PACU Anesthesia Type: General Level of consciousness: awake and alert Pain management: pain level controlled Vital Signs Assessment: post-procedure vital signs reviewed and stable Respiratory status: spontaneous breathing, nonlabored ventilation, respiratory function stable and patient connected to nasal cannula oxygen Cardiovascular status: blood pressure returned to baseline and stable Postop Assessment: no signs of nausea or vomiting Anesthetic complications: no    Last Vitals:  Vitals:   01/12/16 1350 01/12/16 1403  BP: 135/84 119/68  Pulse:  86  Resp:  16  Temp: 36.4 C 36.5 C    Last Pain:  Vitals:   01/12/16 1350  TempSrc:   PainSc: 3                  Zea Kostka DAVID

## 2016-01-12 NOTE — Interval H&P Note (Signed)
History and Physical Interval Note:  01/12/2016 8:03 AM  Marcus Graves  has presented today for surgery, with the diagnosis of right knee osteoarthritis  The various methods of treatment have been discussed with the patient and family. After consideration of risks, benefits and other options for treatment, the patient has consented to  Procedure(s): RIGHT TOTAL KNEE ARTHROPLASTY (Right) as a surgical intervention .  The patient's history has been reviewed, patient examined, no change in status, stable for surgery.  I have reviewed the patient's chart and labs.  Questions were answered to the patient's satisfaction.     Chiquetta Langner A

## 2016-01-13 LAB — BASIC METABOLIC PANEL
Anion gap: 7 (ref 5–15)
BUN: 17 mg/dL (ref 6–20)
CO2: 29 mmol/L (ref 22–32)
Calcium: 8.7 mg/dL — ABNORMAL LOW (ref 8.9–10.3)
Chloride: 100 mmol/L — ABNORMAL LOW (ref 101–111)
Creatinine, Ser: 0.96 mg/dL (ref 0.61–1.24)
GFR calc Af Amer: >60 mL/min (ref >60)
GLUCOSE: 154 mg/dL — AB (ref 65–99)
POTASSIUM: 4.6 mmol/L (ref 3.5–5.1)
Sodium: 136 mmol/L (ref 135–145)

## 2016-01-13 LAB — CBC
HEMATOCRIT: 40.3 % (ref 39.0–52.0)
Hemoglobin: 13.6 g/dL (ref 13.0–17.0)
MCH: 32.9 pg (ref 26.0–34.0)
MCHC: 33.7 g/dL (ref 30.0–36.0)
MCV: 97.3 fL (ref 78.0–100.0)
PLATELETS: 197 10*3/uL (ref 150–400)
RBC: 4.14 MIL/uL — AB (ref 4.22–5.81)
RDW: 12.9 % (ref 11.5–15.5)
WBC: 15.1 10*3/uL — ABNORMAL HIGH (ref 4.0–10.5)

## 2016-01-13 MED ORDER — DOCUSATE SODIUM 100 MG PO CAPS
100.0000 mg | ORAL_CAPSULE | Freq: Two times a day (BID) | ORAL | Status: DC
  Administered 2016-01-13 – 2016-01-14 (×3): 100 mg via ORAL
  Filled 2016-01-13 (×3): qty 1

## 2016-01-13 MED ORDER — METHOCARBAMOL 500 MG PO TABS: 500.0000 mg | ORAL_TABLET | Freq: Four times a day (QID) | ORAL | 1 refills | Status: DC | PRN

## 2016-01-13 MED ORDER — OXYCODONE-ACETAMINOPHEN 5-325 MG PO TABS: 1.0000 | ORAL_TABLET | ORAL | 0 refills | Status: DC | PRN

## 2016-01-13 MED ORDER — ASPIRIN 325 MG PO TABS: 325.0000 mg | ORAL_TABLET | Freq: Two times a day (BID) | ORAL | 0 refills | Status: AC

## 2016-01-13 NOTE — Op Note (Signed)
NAMEDIAMONTE, TUGMAN            ACCOUNT NO.:  [id number]  MEDICAL RECORD NO.:  [id number]  LOCATION:  [facility]                         FACILITY:  [facility]  PHYSICIAN:  Kipp Brood. Loney Peto, M.D.DATE OF BIRTH:  08/09/1950  DATE OF PROCEDURE:  01/12/2016 DATE OF DISCHARGE:                              OPERATIVE REPORT   SURGEON:  Kipp Brood. Gladstone Lighter, M.D.  OPERATIVE ASSISTANT:  Ardeen Jourdain, PA  PREOPERATIVE DIAGNOSIS:  Severe primary osteoarthritis with bone-on-bone of the right knee.  POSTOPERATIVE DIAGNOSIS:  Severe primary osteoarthritis with bone-on- bone of the right knee.  OPERATION:  Right total knee arthroplasty.  I utilized a Primary school teacher, all three components were cemented.  I utilized a size 6 right posterior cruciate sacrificing femoral component, tibial tray with a size 5, the insert was a 10-mm thickness size 6 polyethylene rotating platform.  The patella was a size 41 with 3 pegs.  Gentamicin was used in the cement.  DESCRIPTION OF PROCEDURE:  Under general anesthesia, routine orthopedic prep and draping of the right lower extremity was carried out. Appropriate time-out was first carried out.  I also marked the appropriate right leg in the holding area.  At this time, the leg was exsanguinated with an Esmarch.  Tourniquet was elevated to 350 mmHg. The knee was flexed and an anterior approach of the knee was carried out.  The knee was placed in a DeMayo knee holder.  Two flaps were created.  I then carried out a median parapatellar incision, reflected the patella laterally.  At this time, I went ahead and did my medial and lateral meniscectomies and excised the anterior and posterior cruciate ligaments.  Initial drill hole was made in the intercondylar notch.  The guide rod was inserted to make sure we were up in the canal.  We thoroughly irrigated the canal and then removed 12-mm thickness of the distal femur.  Note, his knee was extremely tight and was very  complex. Once this was done, we sized the femur to be a size 6.  We will do anterior-posterior chamfering cuts for a size 6 right femoral component. We then went down and completed our medial release.  I then cut the remaining anterior-posterior chamfering cuts for a size 6 right femoral component.  Next, attention was directed to the tibia. He had a very hard, thick medial plateau.  I have measured the tibia to be a size 6 for a size 6 tray and we then went on and made our initial drill hole in the tibial plateau, and initially removed 6 mm thickness off the tibia, which really was not enough.  The bone was severely eburnated medially. We went down, took another millimeter off and now had a very nice fit. We then went on and made sure there were no posterior spurs.  We inserted our lamina spreaders.  We had nice clearance posteriorly.  We then inserted our spacer blocks and after the additional cut, we felt much better for his retention.  We then went on and continued with the keel cut of the proximal tibial plateau in the usual fashion after we made our initial drill hole.  We then did our notch cut out of the  distal femur for a size 6 femoral component.  The trial components then were inserted.  We had a nice fit.  We had good extension, good stability, good flexion.  I then did a resurfacing procedure on the patella for a size 41 patella.  Three drill holes then were made in the patellar articular surface.  We then removed all the components, thoroughly water picked out the knee, cemented all 3 components simultaneously.  Once the cement was hardened, we removed all loose pieces of cement.  We water picked the knee out, went back and searched for cement again. I thoroughly irrigated the knee out, cleaned it out, and inserted our permanent tray, which was a 10-mm thickness rotating platform size 6.  We had a nice fit once again.  I then inserted a Hemovac drain and closed the knee in  layers in the usual fashion.  All 3 components were cemented with gentamicin cement.  The remaining part of the wound was closed with subcuticular suture.  We injected 20 mL of Exparel with 20 mL of normal saline.  We did not use Marcaine anesthesia, just not to do that because of the adductor block.  He had an adductor block at the beginning of procedure.  Sterile dressings were applied.  He was given initially 3 g of Ancef and 1 g of tranexamic acid.          ______________________________ Kipp Brood. Gladstone Lighter, M.D.     RAG/MEDQ  D:  01/12/2016  T:  01/12/2016  Job:  [id number]

## 2016-01-13 NOTE — Discharge Instructions (Signed)

## 2016-01-13 NOTE — Evaluation (Signed)
Occupational Therapy Evaluation Patient Details Name: Marcus Graves MRN: [id number] DOB: 1951-03-14 Today's Date: 01/13/2016    History of Present Illness s/p R TKA   Clinical Impression   This 65 year old man was admitted for the above sx. All education was completed.  No further OT is needed at this time    Follow Up Recommendations  Supervision/Assistance - 24 hour    Equipment Recommendations  3 in 1 bedside comode    Recommendations for Other Services       Precautions / Restrictions Precautions Precautions: Knee;Fall Restrictions Weight Bearing Restrictions: No      Mobility Bed Mobility               General bed mobility comments: oob  Transfers Overall transfer level: Needs assistance Equipment used: Rolling walker (2 wheeled) Transfers: Sit to/from Stand Sit to Stand: Min guard         General transfer comment: for safety.  Cues for UE placement    Balance                                            ADL Overall ADL's : Needs assistance/impaired     Grooming: Oral care;Supervision/safety;Standing       Lower Body Bathing: Minimal assistance;Sit to/from stand       Lower Body Dressing: Moderate assistance;Sit to/from stand   Toilet Transfer: Min guard;Ambulation;BSC;RW   Toileting- Water quality scientist and Hygiene: Min guard;Sit to/from stand   Tub/ Shower Transfer: Walk-in shower;Min guard;3 in 1;Ambulation     General ADL Comments: Pt has a foot drop; able to step over tub ledge without difficulty.  Min guard for safety. Will have family help him as needed. He can perform UB adls with setup     Vision     Perception     Praxis      Pertinent Vitals/Pain Pain Assessment: 0-10 Pain Score: 2  Pain Location: R knee Pain Descriptors / Indicators: Sore Pain Intervention(s): Limited activity within patient's tolerance;Monitored during session;Premedicated before session;Repositioned;Ice applied      Hand Dominance     Extremity/Trunk Assessment Upper Extremity Assessment Upper Extremity Assessment: Overall WFL for tasks assessed           Communication Communication Communication: No difficulties   Cognition Arousal/Alertness: Awake/alert Behavior During Therapy: WFL for tasks assessed/performed Overall Cognitive Status: Within Functional Limits for tasks assessed                     General Comments       Exercises       Shoulder Instructions      Home Living Family/patient expects to be discharged to:: Private residence Living Arrangements: Children Available Help at Discharge: Family               Bathroom Shower/Tub: Walk-in Psychologist, prison and probation services: Standard     Home Equipment: None          Prior Functioning/Environment Level of Independence: Independent             OT Diagnosis: Acute pain   OT Problem List:     OT Treatment/Interventions:      OT Goals(Current goals can be found in the care plan section) Acute Rehab OT Goals Patient Stated Goal: return to being independent OT Goal Formulation: All assessment and education complete, DC therapy  OT Frequency:     Barriers to D/C:            Co-evaluation              End of Session    Activity Tolerance: Patient tolerated treatment well Patient left: in chair;with call bell/phone within reach;with chair alarm set   Time: [id number] OT Time Calculation (min): 19 min Charges:  OT General Charges $OT Visit: 1 Procedure OT Evaluation $OT Eval Low Complexity: 1 Procedure G-Codes:    Perri Lamagna 01/30/16, 12:34 PM   Lesle Chris, OTR/L [phone] 30-Jan-2016

## 2016-01-13 NOTE — Care Management Note (Signed)
Case Management Note  Patient Details  Name: Marcus Graves MRN: [id number] Date of Birth: May 16, 1950  Subjective/Objective:                  RIGHT TOTAL KNEE ARTHROPLASTY (Right) Action/Plan: Discharge planning Expected Discharge Date:  01/14/16               Expected Discharge Plan:  Home/Self Care  In-House Referral:  NA  Discharge planning Services  CM Consult  Post Acute Care Choice:  Durable Medical Equipment Choice offered to:  Patient  DME Arranged:  3-N-1, Walker rolling DME Agency:  [place]:  NA [place] Agency:  NA  Status of Service:  Completed, signed off  If discussed at [place] of Stay Meetings, dates discussed:    Additional Comments: CM met with pt in room to confirm is for outpt PT; pt confirms.  CM notified [facility] DME rep, Reggie to please deliver the rolling walker and 3n1 to room so pt can discharge.  No other CM needs were communicated. Dellie Catholic, RN 01/13/2016, 1:47 PM

## 2016-01-13 NOTE — Evaluation (Signed)
Physical Therapy Evaluation Patient Details Name: Marcus Graves MRN: [id number] DOB: [birth year] Today's Date: 01/13/2016   History of Present Illness  s/p R TKA  Clinical Impression  Pt s/p R TKR presents with decreased R LE strength/ROM and post op pain limiting functional mobility.  Pt should progress to dc home with family assist and HHPT follow up.    Follow Up Recommendations Home health PT    Equipment Recommendations  Rolling walker with 5" wheels    Recommendations for Other Services OT consult     Precautions / Restrictions Precautions Precautions: Knee;Fall Required Braces or Orthoses: Knee Immobilizer - Right Knee Immobilizer - Right: Discontinue once straight leg raise with < 10 degree lag (Pt performed IND SLR this am) Restrictions Weight Bearing Restrictions: No      Mobility  Bed Mobility Overal bed mobility: Needs Assistance Bed Mobility: Supine to Sit     Supine to sit: Min guard     General bed mobility comments: cues for sequence and use of L LE to self assist  Transfers Overall transfer level: Needs assistance Equipment used: Rolling walker (2 wheeled) Transfers: Sit to/from Stand Sit to Stand: Min guard         General transfer comment: for safety.  Cues for UE placement  Ambulation/Gait Ambulation/Gait assistance: Min guard Ambulation Distance (Feet): 75 Feet Assistive device: Rolling walker (2 wheeled) Gait Pattern/deviations: Step-to pattern;Decreased step length - right;Decreased step length - left;Shuffle;Trunk flexed Gait velocity: decr Gait velocity interpretation: Below normal speed for age/gender General Gait Details: cues for posture, position from RW and initial sequence  Stairs            Wheelchair Mobility    Modified Rankin (Stroke Patients Only)       Balance                                             Pertinent Vitals/Pain Pain Assessment: 0-10 Pain Score: 4  Pain Location: R  knee Pain Descriptors / Indicators: Aching;Sore Pain Intervention(s): Limited activity within patient's tolerance;Monitored during session;Premedicated before session;Ice applied    Home Living Family/patient expects to be discharged to:: Private residence Living Arrangements: Children Available Help at Discharge: Family Type of Home: House Home Access: Stairs to enter Entrance Stairs-Rails: Right;Left;Can reach both Technical brewer of Steps: 3 Home Layout: Able to live on main level with bedroom/bathroom Home Equipment: [place] - single point;Crutches      Prior Function Level of Independence: Independent               Hand Dominance        Extremity/Trunk Assessment   Upper Extremity Assessment: Overall WFL for tasks assessed           Lower Extremity Assessment: RLE deficits/detail RLE Deficits / Details: Quads strength 3/5 with IND SLR and AAROM at knee -8 - 80    Cervical / Trunk Assessment: Normal  Communication   Communication: No difficulties  Cognition Arousal/Alertness: Awake/alert Behavior During Therapy: WFL for tasks assessed/performed Overall Cognitive Status: Within Functional Limits for tasks assessed                      General Comments      Exercises Total Joint Exercises Ankle Circles/Pumps: AROM;Both;15 reps;Supine Quad Sets: AROM;Both;10 reps;Supine Heel Slides: AAROM;15 reps;Supine;Right Straight Leg Raises: AAROM;AROM;Right;15 reps;Supine  Assessment/Plan    PT Assessment Patient needs continued PT services  PT Diagnosis Difficulty walking   PT Problem List Decreased strength;Decreased range of motion;Decreased activity tolerance;Decreased mobility;Pain;Decreased knowledge of use of DME  PT Treatment Interventions DME instruction;Gait training;Stair training;Functional mobility training;Therapeutic activities;Therapeutic exercise;Patient/family education   PT Goals (Current goals can be found in the Care Plan  section) Acute Rehab PT Goals Patient Stated Goal: return to being independent PT Goal Formulation: With patient Time For Goal Achievement: 01/15/16 Potential to Achieve Goals: Good    Frequency 7X/week   Barriers to discharge        Co-evaluation               End of Session Equipment Utilized During Treatment: Gait belt Activity Tolerance: Patient tolerated treatment well Patient left: in chair;with call bell/phone within reach Nurse Communication: Mobility status         Time: [id number] PT Time Calculation (min) (ACUTE ONLY): 30 min   Charges:   PT Evaluation $PT Eval Low Complexity: 1 Procedure PT Treatments $Therapeutic Exercise: 8-22 mins   PT G Codes:        [name] 02/03/16, 12:54 PM

## 2016-01-13 NOTE — Progress Notes (Signed)
Subjective: 1 Day Post-Op Procedure(s) (LRB): RIGHT TOTAL KNEE ARTHROPLASTY (Right) Patient reports pain as 1 on 0-10 scale. Doing well. Hemovac DCd. Wound looks fine. 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  Objective: Vital signs in last 24 hours: Temp:  [97.6 F (36.4 C)-99 F (37.2 C)] 98.1 F (36.7 C) (09/14 0554) Pulse Rate:  [71-104] 79 (09/14 0554) Resp:  [11-17] 16 (09/14 0554) BP: (113-157)/(65-92) 129/82 (09/14 0554) SpO2:  [90 %-100 %] 99 % (09/14 0554)  Intake/Output from previous day: 09/13 0701 - 09/14 0700 In: 3310 [P.O.:1260; I.V.:2000; IV Piggyback:50] Out: 1860 [Urine:1225; Drains:605; Blood:30] Intake/Output this shift: No intake/output data recorded.   Recent Labs  01/13/16 0424  HGB 13.6    Recent Labs  01/13/16 0424  WBC 15.1*  RBC 4.14*  HCT 40.3  PLT 197    Recent Labs  01/13/16 0424  NA 136  K 4.6  CL 100*  CO2 29  BUN 17  CREATININE 0.96  GLUCOSE 154*  CALCIUM 8.7*   No results for input(s): LABPT, INR in the last 72 hours.  No cellulitis present Compartment soft  Assessment/Plan: 1 Day Post-Op Procedure(s) (LRB): RIGHT TOTAL KNEE ARTHROPLASTY (Right) Up with therapy Plan   on DC tomorrow.  Deziray Nabi A 01/13/2016, 7:10 AM

## 2016-01-13 NOTE — Progress Notes (Signed)
Physical Therapy Treatment Patient Details Name: NATRON NOVELLO MRN: [id number] DOB: 1951/02/27 Today's Date: 01-27-16    History of Present Illness s/p R TKA    PT Comments    Pt very motivated, progressing well with mobility and hopeful for dc to home tomorrow.  Follow Up Recommendations  Home health PT     Equipment Recommendations  Rolling walker with 5" wheels    Recommendations for Other Services OT consult     Precautions / Restrictions Precautions Precautions: Knee;Fall Restrictions Weight Bearing Restrictions: No Other Position/Activity Restrictions: WBAT    Mobility  Bed Mobility Overal bed mobility: Needs Assistance Bed Mobility: Sit to Supine       Sit to supine: Min guard      Transfers Overall transfer level: Needs assistance Equipment used: Rolling walker (2 wheeled) Transfers: Sit to/from Stand Sit to Stand: Min guard         General transfer comment: for safety.  Cues for UE placement  Ambulation/Gait Ambulation/Gait assistance: Min guard Ambulation Distance (Feet): 120 Feet Assistive device: Rolling walker (2 wheeled) Gait Pattern/deviations: Step-to pattern;Step-through pattern;Decreased step length - right;Decreased step length - left;Shuffle Gait velocity: decr Gait velocity interpretation: Below normal speed for age/gender General Gait Details: cues for posture, position from RW and initial sequence   Stairs            Wheelchair Mobility    Modified Rankin (Stroke Patients Only)       Balance                                    Cognition Arousal/Alertness: Awake/alert Behavior During Therapy: WFL for tasks assessed/performed Overall Cognitive Status: Within Functional Limits for tasks assessed                      Exercises      General Comments        Pertinent Vitals/Pain Pain Assessment: 0-10 Pain Score: 5  Pain Location: R knee Pain Descriptors / Indicators:  Aching;Sore Pain Intervention(s): Limited activity within patient's tolerance;Monitored during session;Premedicated before session;Ice applied    Home Living                      Prior Function            PT Goals (current goals can now be found in the care plan section) Acute Rehab PT Goals Patient Stated Goal: return to being independent PT Goal Formulation: With patient Time For Goal Achievement: 01/15/16 Potential to Achieve Goals: Good Progress towards PT goals: Progressing toward goals    Frequency  7X/week    PT Plan Current plan remains appropriate    Co-evaluation             End of Session   Activity Tolerance: Patient tolerated treatment well Patient left: in bed;with call bell/phone within reach     Time: 1435-1457 PT Time Calculation (min) (ACUTE ONLY): 22 min  Charges:  $Gait Training: 8-22 mins                    G Codes:      Ladene Allocca 01-27-16, 5:29 PM

## 2016-01-14 LAB — BASIC METABOLIC PANEL
Anion gap: 6 (ref 5–15)
BUN: 19 mg/dL (ref 6–20)
CALCIUM: 8.6 mg/dL — AB (ref 8.9–10.3)
CO2: 30 mmol/L (ref 22–32)
CREATININE: 0.94 mg/dL (ref 0.61–1.24)
Chloride: 100 mmol/L — ABNORMAL LOW (ref 101–111)
GFR calc non Af Amer: >60 mL/min (ref >60)
GLUCOSE: 156 mg/dL — AB (ref 65–99)
Potassium: 4 mmol/L (ref 3.5–5.1)
Sodium: 136 mmol/L (ref 135–145)

## 2016-01-14 LAB — CBC
HEMATOCRIT: 38.6 % — AB (ref 39.0–52.0)
Hemoglobin: 13 g/dL (ref 13.0–17.0)
MCH: 32.8 pg (ref 26.0–34.0)
MCHC: 33.7 g/dL (ref 30.0–36.0)
MCV: 97.5 fL (ref 78.0–100.0)
Platelets: 189 10*3/uL (ref 150–400)
RBC: 3.96 MIL/uL — ABNORMAL LOW (ref 4.22–5.81)
RDW: 13 % (ref 11.5–15.5)
WBC: 11.6 10*3/uL — ABNORMAL HIGH (ref 4.0–10.5)

## 2016-01-14 MED ORDER — PANTOPRAZOLE SODIUM 40 MG PO TBEC
40.0000 mg | DELAYED_RELEASE_TABLET | Freq: Every day | ORAL | Status: DC
  Administered 2016-01-14: 40 mg via ORAL
  Filled 2016-01-14: qty 1

## 2016-01-14 NOTE — Progress Notes (Signed)
Subjective: 2 Days Post-Op Procedure(s) (LRB): RIGHT TOTAL KNEE ARTHROPLASTY (Right) Patient reports pain as 1 on 0-10 scale.  Doing well this morning.Some gastric burning.HBg 13.0  Objective: Vital signs in last 24 hours: Temp:  [97.4 F (36.3 C)-98.7 F (37.1 C)] 97.4 F (36.3 C) (09/15 0538) Pulse Rate:  [70-88] 86 (09/15 0538) Resp:  [17-18] 17 (09/15 0538) BP: (97-143)/(61-84) 143/84 (09/15 0538) SpO2:  [93 %-99 %] 93 % (09/15 0538)  Intake/Output from previous day: 09/14 0701 - 09/15 0700 In: 1140 [P.O.:1140] Out: 2200 [Urine:2200] Intake/Output this shift: No intake/output data recorded.   Recent Labs  01/13/16 0424 01/14/16 0429  HGB 13.6 13.0    Recent Labs  01/13/16 0424 01/14/16 0429  WBC 15.1* 11.6*  RBC 4.14* 3.96*  HCT 40.3 38.6*  PLT 197 189    Recent Labs  01/13/16 0424 01/14/16 0429  NA 136 136  K 4.6 4.0  CL 100* 100*  CO2 29 30  BUN 17 19  CREATININE 0.96 0.94  GLUCOSE 154* 156*  CALCIUM 8.7* 8.6*   No results for input(s): LABPT, INR in the last 72 hours.  Neurologically intact No cellulitis present Compartment soft  Assessment/Plan: 2 Days Post-Op Procedure(s) (LRB): RIGHT TOTAL KNEE ARTHROPLASTY (Right) Up with therapy Discharge home with home health  Elroy Schembri A 01/14/2016, 7:08 AM

## 2016-01-14 NOTE — Progress Notes (Signed)
Physical Therapy Treatment Patient Details Name: AKILAN NOTO MRN: [id number] DOB: 05/26/1950 Today's Date: 01/14/2016    History of Present Illness s/p R TKA    PT Comments    Pt progressing well and eager for dc home.  Reviewed therex and stairs.  Follow Up Recommendations  Home health PT     Equipment Recommendations  Rolling walker with 5" wheels    Recommendations for Other Services OT consult     Precautions / Restrictions Precautions Precautions: Knee;Fall Required Braces or Orthoses: Knee Immobilizer - Right Knee Immobilizer - Right: Discontinue once straight leg raise with < 10 degree lag Restrictions Weight Bearing Restrictions: No Other Position/Activity Restrictions: WBAT    Mobility  Bed Mobility Overal bed mobility: Needs Assistance Bed Mobility: Supine to Sit     Supine to sit: Supervision        Transfers Overall transfer level: Needs assistance Equipment used: Rolling walker (2 wheeled) Transfers: Sit to/from Stand Sit to Stand: Supervision         General transfer comment: for safety.  Cues for UE placement  Ambulation/Gait Ambulation/Gait assistance: Supervision Ambulation Distance (Feet): 150 Feet Assistive device: Rolling walker (2 wheeled) Gait Pattern/deviations: Step-to pattern;Step-through pattern;Shuffle;Trunk flexed     General Gait Details: min cues for posture, position from RW and initial sequence   Stairs Stairs: Yes Stairs assistance: Min guard Stair Management: Two rails;Step to pattern;Forwards Number of Stairs: 5 General stair comments: cues for sequence and foot placement  Wheelchair Mobility    Modified Rankin (Stroke Patients Only)       Balance                                    Cognition Arousal/Alertness: Awake/alert Behavior During Therapy: WFL for tasks assessed/performed Overall Cognitive Status: Within Functional Limits for tasks assessed                       Exercises Total Joint Exercises Ankle Circles/Pumps: AROM;Both;15 reps;Supine Quad Sets: AROM;Both;Supine;15 reps Heel Slides: AAROM;Supine;Right;20 reps Straight Leg Raises: AAROM;AROM;Right;Supine;20 reps Long Arc Quad: AROM;Right;10 reps;Seated Goniometric ROM: AAROM R knee -8 - 90    General Comments        Pertinent Vitals/Pain Pain Assessment: 0-10 Pain Score: 4  Pain Location: R knee Pain Descriptors / Indicators: Aching;Sore Pain Intervention(s): Limited activity within patient's tolerance;Monitored during session;Premedicated before session;Ice applied    Home Living                      Prior Function            PT Goals (current goals can now be found in the care plan section) Acute Rehab PT Goals Patient Stated Goal: return to being independent PT Goal Formulation: With patient Time For Goal Achievement: 01/15/16 Potential to Achieve Goals: Good Progress towards PT goals: Progressing toward goals    Frequency   PT Diagnosis 7X/week   No diagnosis found.    PT Plan Current plan remains appropriate    Co-evaluation             End of Session Equipment Utilized During Treatment: Gait belt Activity Tolerance: Patient tolerated treatment well Patient left: with call bell/phone within reach;in chair     Time: [id number] PT Time Calculation (min) (ACUTE ONLY): 38 min  Charges:  $Gait Training: 8-22 mins $Therapeutic Exercise: 23-37 mins  G Codes:      Bayyinah Dukeman 2016-02-06, 12:59 PM

## 2016-01-17 NOTE — Discharge Summary (Signed)
Physician Discharge Summary   Patient ID: Marcus Graves MRN: [id number] DOB/AGE: 06/26/1950 65 y.o.  Admit date: 01/12/2016 Discharge date: 01/14/2016  Primary Diagnosis: Primary osteoarthritis right knee  Admission Diagnoses:  Past Medical History:  Diagnosis Date  . Anxiety   . Arthritis   . Asthma   . Diverticulitis   . GERD (gastroesophageal reflux disease)   . Kidney stone   . Pneumonia    Discharge Diagnoses:   Active Problems:   S/P TKR (total knee replacement) using cement  Estimated body mass index is 35.94 kg/m as calculated from the following:   Height as of this encounter: 6' (1.829 m).   Weight as of this encounter: 120.2 kg (265 lb).  Procedure:  Procedure(s) (LRB): RIGHT TOTAL KNEE ARTHROPLASTY (Right)   Consults: None  HPI: Marcus Graves, 65 y.o. male, has a history of pain and functional disability in the right knee due to arthritis and has failed non-surgical conservative treatments for greater than 12 weeks to includeNSAID's and/or analgesics, corticosteriod injections, flexibility and strengthening excercises and activity modification.  Onset of symptoms was gradual, starting 8 years ago with gradually worsening course since that time. The patient noted prior procedures on the knee to include  arthroscopy and menisectomy on the right knee(s).  Patient currently rates pain in the right knee(s) at 8 out of 10 with activity. Patient has night pain, worsening of pain with activity and weight bearing, pain that interferes with activities of daily living, pain with passive range of motion, crepitus and joint swelling.  Patient has evidence of periarticular osteophytes and joint space narrowing by imaging studies. There is no active infection.  Laboratory Data: Admission on 01/12/2016, Discharged on 01/14/2016  Component Date Value Ref Range Status  . WBC 01/13/2016 15.1* 4.0 - 10.5 K/uL Final  . RBC 01/13/2016 4.14* 4.22 - 5.81 MIL/uL Final  .  Hemoglobin 01/13/2016 13.6  13.0 - 17.0 g/dL Final  . HCT 01/13/2016 40.3  39.0 - 52.0 % Final  . MCV 01/13/2016 97.3  78.0 - 100.0 fL Final  . MCH 01/13/2016 32.9  26.0 - 34.0 pg Final  . MCHC 01/13/2016 33.7  30.0 - 36.0 g/dL Final  . RDW 01/13/2016 12.9  11.5 - 15.5 % Final  . Platelets 01/13/2016 197  150 - 400 K/uL Final  . Sodium 01/13/2016 136  135 - 145 mmol/L Final  . Potassium 01/13/2016 4.6  3.5 - 5.1 mmol/L Final  . Chloride 01/13/2016 100* 101 - 111 mmol/L Final  . CO2 01/13/2016 29  22 - 32 mmol/L Final  . Glucose, Bld 01/13/2016 154* 65 - 99 mg/dL Final  . BUN 01/13/2016 17  6 - 20 mg/dL Final  . Creatinine, Ser 01/13/2016 0.96  0.61 - 1.24 mg/dL Final  . Calcium 01/13/2016 8.7* 8.9 - 10.3 mg/dL Final  . GFR calc non Af Amer 01/13/2016 >60  >60 mL/min Final  . GFR calc Af Amer 01/13/2016 >60  >60 mL/min Final   Comment: (NOTE) The eGFR has been calculated using the CKD EPI equation. This calculation has not been validated in all clinical situations. eGFR's persistently <60 mL/min signify possible Chronic Kidney Disease.   . Anion gap 01/13/2016 7  5 - 15 Final  . WBC 01/14/2016 11.6* 4.0 - 10.5 K/uL Final  . RBC 01/14/2016 3.96* 4.22 - 5.81 MIL/uL Final  . Hemoglobin 01/14/2016 13.0  13.0 - 17.0 g/dL Final  . HCT 01/14/2016 38.6* 39.0 - 52.0 % Final  . MCV 01/14/2016  97.5  78.0 - 100.0 fL Final  . MCH 01/14/2016 32.8  26.0 - 34.0 pg Final  . MCHC 01/14/2016 33.7  30.0 - 36.0 g/dL Final  . RDW 01/14/2016 13.0  11.5 - 15.5 % Final  . Platelets 01/14/2016 189  150 - 400 K/uL Final  . Sodium 01/14/2016 136  135 - 145 mmol/L Final  . Potassium 01/14/2016 4.0  3.5 - 5.1 mmol/L Final  . Chloride 01/14/2016 100* 101 - 111 mmol/L Final  . CO2 01/14/2016 30  22 - 32 mmol/L Final  . Glucose, Bld 01/14/2016 156* 65 - 99 mg/dL Final  . BUN 01/14/2016 19  6 - 20 mg/dL Final  . Creatinine, Ser 01/14/2016 0.94  0.61 - 1.24 mg/dL Final  . Calcium 01/14/2016 8.6* 8.9 - 10.3 mg/dL  Final  . GFR calc non Af Amer 01/14/2016 >60  >60 mL/min Final  . GFR calc Af Amer 01/14/2016 >60  >60 mL/min Final   Comment: (NOTE) The eGFR has been calculated using the CKD EPI equation. This calculation has not been validated in all clinical situations. eGFR's persistently <60 mL/min signify possible Chronic Kidney Disease.   Georgiann Hahn gap 01/14/2016 6  5 - 15 Final  Hospital Outpatient Visit on 01/05/2016  Component Date Value Ref Range Status  . aPTT 01/05/2016 32  24 - 36 seconds Final  . WBC 01/05/2016 4.9  4.0 - 10.5 K/uL Final  . RBC 01/05/2016 4.60  4.22 - 5.81 MIL/uL Final  . Hemoglobin 01/05/2016 15.0  13.0 - 17.0 g/dL Final  . HCT 01/05/2016 43.0  39.0 - 52.0 % Final  . MCV 01/05/2016 93.5  78.0 - 100.0 fL Final  . MCH 01/05/2016 32.6  26.0 - 34.0 pg Final  . MCHC 01/05/2016 34.9  30.0 - 36.0 g/dL Final  . RDW 01/05/2016 12.6  11.5 - 15.5 % Final  . Platelets 01/05/2016 206  150 - 400 K/uL Final  . Neutrophils Relative % 01/05/2016 62  % Final  . Neutro Abs 01/05/2016 3.1  1.7 - 7.7 K/uL Final  . Lymphocytes Relative 01/05/2016 23  % Final  . Lymphs Abs 01/05/2016 1.1  0.7 - 4.0 K/uL Final  . Monocytes Relative 01/05/2016 10  % Final  . Monocytes Absolute 01/05/2016 0.5  0.1 - 1.0 K/uL Final  . Eosinophils Relative 01/05/2016 4  % Final  . Eosinophils Absolute 01/05/2016 0.2  0.0 - 0.7 K/uL Final  . Basophils Relative 01/05/2016 1  % Final  . Basophils Absolute 01/05/2016 0.0  0.0 - 0.1 K/uL Final  . Sodium 01/05/2016 140  135 - 145 mmol/L Final  . Potassium 01/05/2016 3.9  3.5 - 5.1 mmol/L Final  . Chloride 01/05/2016 105  101 - 111 mmol/L Final  . CO2 01/05/2016 28  22 - 32 mmol/L Final  . Glucose, Bld 01/05/2016 98  65 - 99 mg/dL Final  . BUN 01/05/2016 13  6 - 20 mg/dL Final  . Creatinine, Ser 01/05/2016 0.88  0.61 - 1.24 mg/dL Final  . Calcium 01/05/2016 9.1  8.9 - 10.3 mg/dL Final  . Total Protein 01/05/2016 7.2  6.5 - 8.1 g/dL Final  . Albumin 01/05/2016  4.3  3.5 - 5.0 g/dL Final  . AST 01/05/2016 42* 15 - 41 U/L Final  . ALT 01/05/2016 47  17 - 63 U/L Final  . Alkaline Phosphatase 01/05/2016 46  38 - 126 U/L Final  . Total Bilirubin 01/05/2016 1.1  0.3 - 1.2 mg/dL Final  . GFR  calc non Af Amer 01/05/2016 >60  >60 mL/min Final  . GFR calc Af Amer 01/05/2016 >60  >60 mL/min Final   Comment: (NOTE) The eGFR has been calculated using the CKD EPI equation. This calculation has not been validated in all clinical situations. eGFR's persistently <60 mL/min signify possible Chronic Kidney Disease.   . Anion gap 01/05/2016 7  5 - 15 Final  . Prothrombin Time 01/05/2016 13.6  11.4 - 15.2 seconds Final  . INR 01/05/2016 1.04   Final  . ABO/RH(D) 01/12/2016 A POS   Final  . Antibody Screen 01/12/2016 NEG   Final  . Sample Expiration 01/12/2016 01/15/2016   Final  . Extend sample reason 01/12/2016 NO TRANSFUSIONS OR PREGNANCY IN THE PAST 3 MONTHS   Final  . Color, Urine 01/05/2016 YELLOW  YELLOW Final  . APPearance 01/05/2016 CLEAR  CLEAR Final  . Specific Gravity, Urine 01/05/2016 1.024  1.005 - 1.030 Final  . pH 01/05/2016 6.0  5.0 - 8.0 Final  . Glucose, UA 01/05/2016 NEGATIVE  NEGATIVE mg/dL Final  . Hgb urine dipstick 01/05/2016 NEGATIVE  NEGATIVE Final  . Bilirubin Urine 01/05/2016 NEGATIVE  NEGATIVE Final  . Ketones, ur 01/05/2016 NEGATIVE  NEGATIVE mg/dL Final  . Protein, ur 01/05/2016 NEGATIVE  NEGATIVE mg/dL Final  . Nitrite 01/05/2016 NEGATIVE  NEGATIVE Final  . Leukocytes, UA 01/05/2016 NEGATIVE  NEGATIVE Final  . MRSA, PCR 01/05/2016 NEGATIVE  NEGATIVE Final  . Staphylococcus aureus 01/05/2016 POSITIVE* NEGATIVE Final   Comment:        The Xpert SA Assay (FDA approved for NASAL specimens in patients over 40 years of age), is one component of a comprehensive surveillance program.  Test performance has been validated by [facility] for patients greater than or equal to 58 year old. It is not intended to diagnose infection nor  to guide or monitor treatment.   . ABO/RH(D) 01/05/2016 A POS   Final     X-Rays:Dg Chest 2 View  Result Date: 01/05/2016 CLINICAL DATA:  Preoperative evaluation for total knee arthroplasty EXAM: CHEST  2 VIEW COMPARISON:  None. FINDINGS: Lungs are clear. Heart size and pulmonary vascularity are normal. There is atherosclerotic calcification in the aortic arch. No apparent adenopathy. No bone lesions. IMPRESSION: Aortic atherosclerosis.  No edema or consolidation. Electronically Signed   By: Lowella Grip III M.D.   On: 01/05/2016 10:34    EKG: Orders placed or performed during the hospital encounter of 01/05/16  . EKG  . EKG     Hospital Course: CASSIUS CULLINANE is a 65 y.o. who was admitted to [facility]. They were brought to the operating room on 01/12/2016 and underwent Procedure(s): RIGHT TOTAL KNEE ARTHROPLASTY.  Patient tolerated the procedure well and was later transferred to the recovery room and then to the orthopaedic floor for postoperative care.  They were given PO and IV analgesics for pain control following their surgery.  They were given 24 hours of postoperative antibiotics of  Anti-infectives    Start     Dose/Rate Route Frequency Ordered Stop   01/12/16 1430  ceFAZolin (ANCEF) IVPB 1 g/50 mL premix     1 g 100 mL/hr over 30 Minutes Intravenous Every 6 hours 01/12/16 1240 01/12/16 2253   01/12/16 0859  polymyxin B 500,000 Units, bacitracin 50,000 Units in sodium chloride irrigation 0.9 % 500 mL irrigation  Status:  Discontinued       As needed 01/12/16 0915 01/12/16 1103   01/12/16 0600  ceFAZolin (  ANCEF) 3 g in dextrose 5 % 50 mL IVPB     3 g 130 mL/hr over 30 Minutes Intravenous On call to O.R. 01/11/16 1327 01/12/16 0831     and started on DVT prophylaxis in the form of Xarelto.   PT and OT were ordered for total joint protocol.  Discharge planning consulted to help with postop disposition and equipment needs.  Patient had a good night on the evening  of surgery.  They started to get up OOB with therapy on day one. Hemovac drain was pulled without difficulty.  Continued to work with therapy into day two.  Dressing was changed on day two and the incision was clean and dry.  The patient had progressed with therapy and meeting their goals.  Incision was healing well.  Patient was seen in rounds and was ready to go home. Patient was transitioned to aspirin '325mg'$  BID instead of Xarelto for DVT prophylaxis.    Diet: Cardiac diet Activity:WBAT Follow-up:in 2 weeks Disposition - Home Discharged Condition: stable   Discharge Instructions    Call MD / Call 911    Complete by:  As directed    If you experience chest pain or shortness of breath, CALL 911 and be transported to the hospital emergency room.  If you develope a fever above 101 F, pus (white drainage) or increased drainage or redness at the wound, or calf pain, call your surgeon's office.   Constipation Prevention    Complete by:  As directed    Drink plenty of fluids.  Prune juice may be helpful.  You may use a stool softener, such as Colace (over the counter) 100 mg twice a day.  Use MiraLax (over the counter) for constipation as needed.   Diet - low sodium heart healthy    Complete by:  As directed    Discharge instructions    Complete by:  As directed    INSTRUCTIONS AFTER JOINT REPLACEMENT   Remove items at home which could result in a fall. This includes throw rugs or furniture in walking pathways ICE to the affected joint every three hours while awake for 30 minutes at a time, for at least the first 3-5 days, and then as needed for pain and swelling.  Continue to use ice for pain and swelling. You may notice swelling that will progress down to the foot and ankle.  This is normal after surgery.  Elevate your leg when you are not up walking on it.   Continue to use the breathing machine you got in the hospital (incentive spirometer) which will help keep your temperature down.  It is  common for your temperature to cycle up and down following surgery, especially at night when you are not up moving around and exerting yourself.  The breathing machine keeps your lungs expanded and your temperature down.   DIET:  As you were doing prior to hospitalization, we recommend a well-balanced diet.  DRESSING / WOUND CARE / SHOWERING  You may change your dressing every day with sterile gauze.  Please use good hand washing techniques before changing the dressing.  Do not use any lotions or creams on the incision until instructed by your surgeon.  ACTIVITY  Increase activity slowly as tolerated, but follow the weight bearing instructions below.   No driving for 6 weeks or until further direction given by your physician.  You cannot drive while taking narcotics.  No lifting or carrying greater than 10 lbs. until further directed by  your surgeon. Avoid periods of inactivity such as sitting longer than an hour when not asleep. This helps prevent blood clots.  You may return to work once you are authorized by your doctor.     WEIGHT BEARING   Weight bearing as tolerated with assist device (walker, cane, etc) as directed, use it as long as suggested by your surgeon or therapist, typically at least 4-6 weeks.   EXERCISES  Results after joint replacement surgery are often greatly improved when you follow the exercise, range of motion and muscle strengthening exercises prescribed by your doctor. Safety measures are also important to protect the joint from further injury. Any time any of these exercises cause you to have increased pain or swelling, decrease what you are doing until you are comfortable again and then slowly increase them. If you have problems or questions, call your caregiver or physical therapist for advice.   Rehabilitation is important following a joint replacement. After just a few days of immobilization, the muscles of the leg can become weakened and shrink (atrophy).   These exercises are designed to build up the tone and strength of the thigh and leg muscles and to improve motion. Often times heat used for twenty to thirty minutes before working out will loosen up your tissues and help with improving the range of motion but do not use heat for the first two weeks following surgery (sometimes heat can increase post-operative swelling).   These exercises can be done on a training (exercise) mat, on the floor, on a table or on a bed. Use whatever works the best and is most comfortable for you.    Use music or television while you are exercising so that the exercises are a pleasant break in your day. This will make your life better with the exercises acting as a break in your routine that you can look forward to.   Perform all exercises about fifteen times, three times per day or as directed.  You should exercise both the operative leg and the other leg as well.  Exercises include:   Quad Sets - Tighten up the muscle on the front of the thigh (Quad) and hold for 5-10 seconds.   Straight Leg Raises - With your knee straight (if you were given a brace, keep it on), lift the leg to 60 degrees, hold for 3 seconds, and slowly lower the leg.  Perform this exercise against resistance later as your leg gets stronger.  Leg Slides: Lying on your back, slowly slide your foot toward your buttocks, bending your knee up off the floor (only go as far as is comfortable). Then slowly slide your foot back down until your leg is flat on the floor again.  Angel Wings: Lying on your back spread your legs to the side as far apart as you can without causing discomfort.  Hamstring Strength:  Lying on your back, push your heel against the floor with your leg straight by tightening up the muscles of your buttocks.  Repeat, but this time bend your knee to a comfortable angle, and push your heel against the floor.  You may put a pillow under the heel to make it more comfortable if necessary.   A  rehabilitation program following joint replacement surgery can speed recovery and prevent re-injury in the future due to weakened muscles. Contact your doctor or a physical therapist for more information on knee rehabilitation.    CONSTIPATION  Constipation is defined medically as fewer than three stools  per week and severe constipation as less than one stool per week.  Even if you have a regular bowel pattern at home, your normal regimen is likely to be disrupted due to multiple reasons following surgery.  Combination of anesthesia, postoperative narcotics, change in appetite and fluid intake all can affect your bowels.   YOU MUST use at least one of the following options; they are listed in order of increasing strength to get the job done.  They are all available over the counter, and you may need to use some, POSSIBLY even all of these options:    Drink plenty of fluids (prune juice may be helpful) and high fiber foods Colace 100 mg by mouth twice a day  Senokot for constipation as directed and as needed Dulcolax (bisacodyl), take with full glass of water  Miralax (polyethylene glycol) once or twice a day as needed.  If you have tried all these things and are unable to have a bowel movement in the first 3-4 days after surgery call either your surgeon or your primary doctor.    If you experience loose stools or diarrhea, hold the medications until you stool forms back up.  If your symptoms do not get better within 1 week or if they get worse, check with your doctor.  If you experience "the worst abdominal pain ever" or develop nausea or vomiting, please contact the office immediately for further recommendations for treatment.   ITCHING:  If you experience itching with your medications, try taking only a single pain pill, or even half a pain pill at a time.  You can also use Benadryl over the counter for itching or also to help with sleep.   TED HOSE STOCKINGS:  Use stockings on both legs until  for at least 2 weeks or as directed by physician office. They may be removed at night for sleeping.  MEDICATIONS:  See your medication summary on the "After Visit Summary" that nursing will review with you.  You may have some home medications which will be placed on hold until you complete the course of blood thinner medication.  It is important for you to complete the blood thinner medication as prescribed.  PRECAUTIONS:  If you experience chest pain or shortness of breath - call 911 immediately for transfer to the hospital emergency department.   If you develop a fever greater that 101 F, purulent drainage from wound, increased redness or drainage from wound, foul odor from the wound/dressing, or calf pain - CONTACT YOUR SURGEON.                                                   FOLLOW-UP APPOINTMENTS:  If you do not already have a post-op appointment, please call the office for an appointment to be seen by your surgeon.  Guidelines for how soon to be seen are listed in your "After Visit Summary", but are typically between 1-4 weeks after surgery.   MAKE SURE YOU:  Understand these instructions.  Get help right away if you are not doing well or get worse.    Thank you for letting us be a part of your medical care team.  It is a privilege we respect greatly.  We hope these instructions will help you stay on track for a fast and full recovery!   Increase activity slowly as  tolerated    Complete by:  As directed        Medication List    TAKE these medications   albuterol 108 (90 Base) MCG/ACT inhaler Commonly known as:  PROVENTIL HFA;VENTOLIN HFA Inhale 2 puffs into the lungs every 4 (four) hours as needed for wheezing or shortness of breath.   ALPRAZolam 0.5 MG tablet Commonly known as:  XANAX Take 0.5 mg by mouth at bedtime.   aspirin 325 MG tablet Take 1 tablet (325 mg total) by mouth 2 (two) times daily. What changed:  when to take this   citalopram 10 MG tablet Commonly  known as:  CELEXA Take 10 mg by mouth daily.   levothyroxine 50 MCG tablet Commonly known as:  SYNTHROID, LEVOTHROID Take 50 mcg by mouth daily before breakfast.   methocarbamol 500 MG tablet Commonly known as:  ROBAXIN Take 1 tablet (500 mg total) by mouth every 6 (six) hours as needed for muscle spasms.   oxyCODONE-acetaminophen 5-325 MG tablet Commonly known as:  PERCOCET/ROXICET Take 1-2 tablets by mouth every 4 (four) hours as needed for moderate pain.      Follow-up Information    GIOFFRE,RONALD A, MD. Schedule an appointment as soon as possible for a visit in 2 week(s).   Specialty:  Orthopedic Surgery Contact information: [address] [place] [address] [phone]        Inc[facility] - Dme [facility] .   Why:  rolling walker and 3n1 Contact information: [place] [id number] [phone]           Signed: Ardeen Jourdain, PA-C Orthopaedic Surgery 01/17/2016, 8:41 AM

## 2017-08-03 ENCOUNTER — Encounter: Payer: Self-pay | Admitting: Gastroenterology

## 2017-08-20 ENCOUNTER — Other Ambulatory Visit: Payer: Self-pay

## 2017-08-20 ENCOUNTER — Encounter: Payer: Self-pay | Admitting: Gastroenterology

## 2017-08-20 ENCOUNTER — Ambulatory Visit (AMBULATORY_SURGERY_CENTER): Payer: Self-pay

## 2017-08-20 VITALS — Ht 72.0 in | Wt 261.2 lb

## 2017-08-20 DIAGNOSIS — Z86010 Personal history of colonic polyps: Secondary | ICD-10-CM

## 2017-08-20 NOTE — Progress Notes (Signed)
No egg or soy allergy known to patient  No issues with past sedation with any surgeries  or procedures, no intubation problems  No diet pills per patient No home 02 use per patient  No blood thinners per patient  Pt denies issues with constipation  No A fib or A flutter  EMMI video sent to pt's e mail  

## 2017-09-03 ENCOUNTER — Other Ambulatory Visit: Payer: Self-pay

## 2017-09-03 ENCOUNTER — Ambulatory Visit (AMBULATORY_SURGERY_CENTER): Payer: Medicare HMO | Admitting: Gastroenterology

## 2017-09-03 ENCOUNTER — Encounter: Payer: Self-pay | Admitting: Gastroenterology

## 2017-09-03 VITALS — BP 119/76 | HR 56 | Temp 98.4°F | Resp 11 | Ht 72.0 in | Wt 261.0 lb

## 2017-09-03 DIAGNOSIS — Z86010 Personal history of colonic polyps: Secondary | ICD-10-CM | POA: Diagnosis not present

## 2017-09-03 DIAGNOSIS — D123 Benign neoplasm of transverse colon: Secondary | ICD-10-CM | POA: Diagnosis not present

## 2017-09-03 MED ORDER — SODIUM CHLORIDE 0.9 % IV SOLN: 500.0000 mL | Freq: Once | INTRAVENOUS | Status: AC

## 2017-09-03 NOTE — Progress Notes (Signed)
Pt's states no medical or surgical changes since previsit or office visit.Pt's states no medical or surgical changes since previsit or office visit. 

## 2017-09-03 NOTE — Progress Notes (Signed)
Called to room to assist during endoscopic procedure.  Patient ID and intended procedure confirmed with present staff. Received instructions for my participation in the procedure from the performing physician.  

## 2017-09-03 NOTE — Op Note (Signed)
[facility] Patient Name: Marcus Graves Procedure Date: 09/03/2017 10:11 AM MRN: [id number] Endoscopist: Jackquline Denmark MD, MD Age: 67 Referring MD:  Date of Birth: 08/23/1950 Gender: Male Account #: [id number] Procedure:                Colonoscopy Indications:              High risk colon cancer surveillance: Personal                            history of colonic polyps Medicines:                Monitored Anesthesia Care Procedure:                Pre-Anesthesia Assessment:                           - Prior to the procedure, a History and Physical                            was performed, and patient medications and                            allergies were reviewed. The patient is competent.                            The risks and benefits of the procedure and the                            sedation options and risks were discussed with the                            patient. All questions were answered and informed                            consent was obtained. Patient identification and                            proposed procedure were verified by the physician                            in the procedure room. Mental Status Examination:                            alert and oriented. Prophylactic Antibiotics: The                            patient does not require prophylactic antibiotics.                            Prior Anticoagulants: The patient has taken no                            previous anticoagulant or antiplatelet agents. ASA  Grade Assessment: II - A patient with mild systemic                            disease. After reviewing the risks and benefits,                            the patient was deemed in satisfactory condition to                            undergo the procedure. The anesthesia plan was to                            use monitored anesthesia care (MAC). Immediately                            prior to administration  of medications, the patient                            was re-assessed for adequacy to receive sedatives.                            The heart rate, respiratory rate, oxygen                            saturations, blood pressure, adequacy of pulmonary                            ventilation, and response to care were monitored                            throughout the procedure. The physical status of                            the patient was re-assessed after the procedure.                           After obtaining informed consent, the colonoscope                            was passed under direct vision. Throughout the                            procedure, the patient's blood pressure, pulse, and                            oxygen saturations were monitored continuously. The                            Model CF-HQ190L [id number]) scope was introduced                            through the anus and advanced to the the cecum,  identified by appendiceal orifice and ileocecal                            valve. The colonoscopy was somewhat difficult due                            to multiple diverticula in the colon. The patient                            tolerated the procedure well. The quality of the                            bowel preparation was good. Scope In: 10:19:09 AM Scope Out: 10:38:29 AM Scope Withdrawal Time: 0 hours 11 minutes 43 seconds  Total Procedure Duration: 0 hours 19 minutes 20 seconds  Findings:                 A 6 mm polyp was found in the proximal transverse                            colon. The polyp was sessile. The polyp was removed                            with a cold snare. Resection and retrieval were                            complete.                           Multiple small-mouthed diverticula were found in                            the sigmoid colon and descending colon. few                            diverticula had stool  impacted. There are a few                            diverticula also in the ascending colon and few in                            the transverse colon.                           Non-bleeding internal hemorrhoids were found during                            retroflexion. The hemorrhoids were small.                           The exam was otherwise without abnormality. Complications:            No immediate complications. Estimated Blood Loss:     Estimated blood loss: none. Impression:               -  Colonic polyps status post polypectomy                           - Pancolonic diverticulosis predominantly in the                            left colon. Recommendation:           - Patient has a contact number available for                            emergencies. The signs and symptoms of potential                            delayed complications were discussed with the                            patient. Return to normal activities tomorrow.                            Written discharge instructions were provided to the                            patient.                           - High fiber diet.                           - Continue present medications.                           - Await pathology results.                           - Repeat colonoscopy for surveillance based on                            pathology results. Jackquline Denmark MD, MD 09/03/2017 10:46:52 AM This report has been signed electronically.

## 2017-09-03 NOTE — Progress Notes (Signed)
Report to PACU, RN, vss, BBS= Clear.  

## 2017-09-03 NOTE — Patient Instructions (Signed)
HANDOUTS GIVEN FOR POLYPS, HEMORRHOIDS, DIVERTICULOSIS AND HIGH FIBER DIET  YOU HAD AN ENDOSCOPIC PROCEDURE TODAY AT [facility]:   Refer to the procedure report that was given to you for any specific questions about what was found during the examination.  If the procedure report does not answer your questions, please call your gastroenterologist to clarify.  If you requested that your care partner not be given the details of your procedure findings, then the procedure report has been included in a sealed envelope for you to review at your convenience later.  YOU SHOULD EXPECT: Some feelings of bloating in the abdomen. Passage of more gas than usual.  Walking can help get rid of the air that was put into your GI tract during the procedure and reduce the bloating. If you had a lower endoscopy (such as a colonoscopy or flexible sigmoidoscopy) you may notice spotting of blood in your stool or on the toilet paper. If you underwent a bowel prep for your procedure, you may not have a normal bowel movement for a few days.  Please Note:  You might notice some irritation and congestion in your nose or some drainage.  This is from the oxygen used during your procedure.  There is no need for concern and it should clear up in a day or so.  SYMPTOMS TO REPORT IMMEDIATELY:   Following lower endoscopy (colonoscopy or flexible sigmoidoscopy):  Excessive amounts of blood in the stool  Significant tenderness or worsening of abdominal pains  Swelling of the abdomen that is new, acute  Fever of 100F or higher   For urgent or emergent issues, a gastroenterologist can be reached at any hour by calling [phone].   DIET:  We do recommend a small meal at first, but then you may proceed to your regular diet.  Drink plenty of fluids but you should avoid alcoholic beverages for 24 hours.  ACTIVITY:  You should plan to take it easy for the rest of today and you should NOT DRIVE or use heavy  machinery until tomorrow (because of the sedation medicines used during the test).    FOLLOW UP: Our staff will call the number listed on your records the next business day following your procedure to check on you and address any questions or concerns that you may have regarding the information given to you following your procedure. If we do not reach you, we will leave a message.  However, if you are feeling well and you are not experiencing any problems, there is no need to return our call.  We will assume that you have returned to your regular daily activities without incident.  If any biopsies were taken you will be contacted by phone or by letter within the next 1-3 weeks.  Please call us at [phone] if you have not heard about the biopsies in 3 weeks.    SIGNATURES/CONFIDENTIALITY: You and/or your care partner have signed paperwork which will be entered into your electronic medical record.  These signatures attest to the fact that that the information above on your After Visit Summary has been reviewed and is understood.  Full responsibility of the confidentiality of this discharge information lies with you and/or your care-partner.

## 2017-09-04 ENCOUNTER — Telehealth: Payer: Self-pay

## 2017-09-04 NOTE — Telephone Encounter (Signed)
  Follow up Call-  Call back number 09/03/2017  Post procedure Call Back phone  # [phone]  Permission to leave phone message Yes     Patient questions:  Do you have a fever, pain , or abdominal swelling? Yes.   Pain Score  2 * Lower abd Have you tolerated food without any problems? Yes.    Have you been able to return to your normal activities? Yes.    Do you have any questions about your discharge instructions: Diet   No. Medications  No. Follow up visit  No.  Do you have questions or concerns about your Care? No.  Actions: * If pain score is 4 or above: No action needed, pain <4.

## 2017-09-07 ENCOUNTER — Encounter: Payer: Self-pay | Admitting: Gastroenterology

## 2018-02-17 IMAGING — DX DG CHEST 2V
2 series · 2 of 2 positions shown · non-contrast
Comparison: None.

CLINICAL DATA: Preoperative evaluation for total knee arthroplasty

EXAM:
CHEST  2 VIEW

[chest pa]
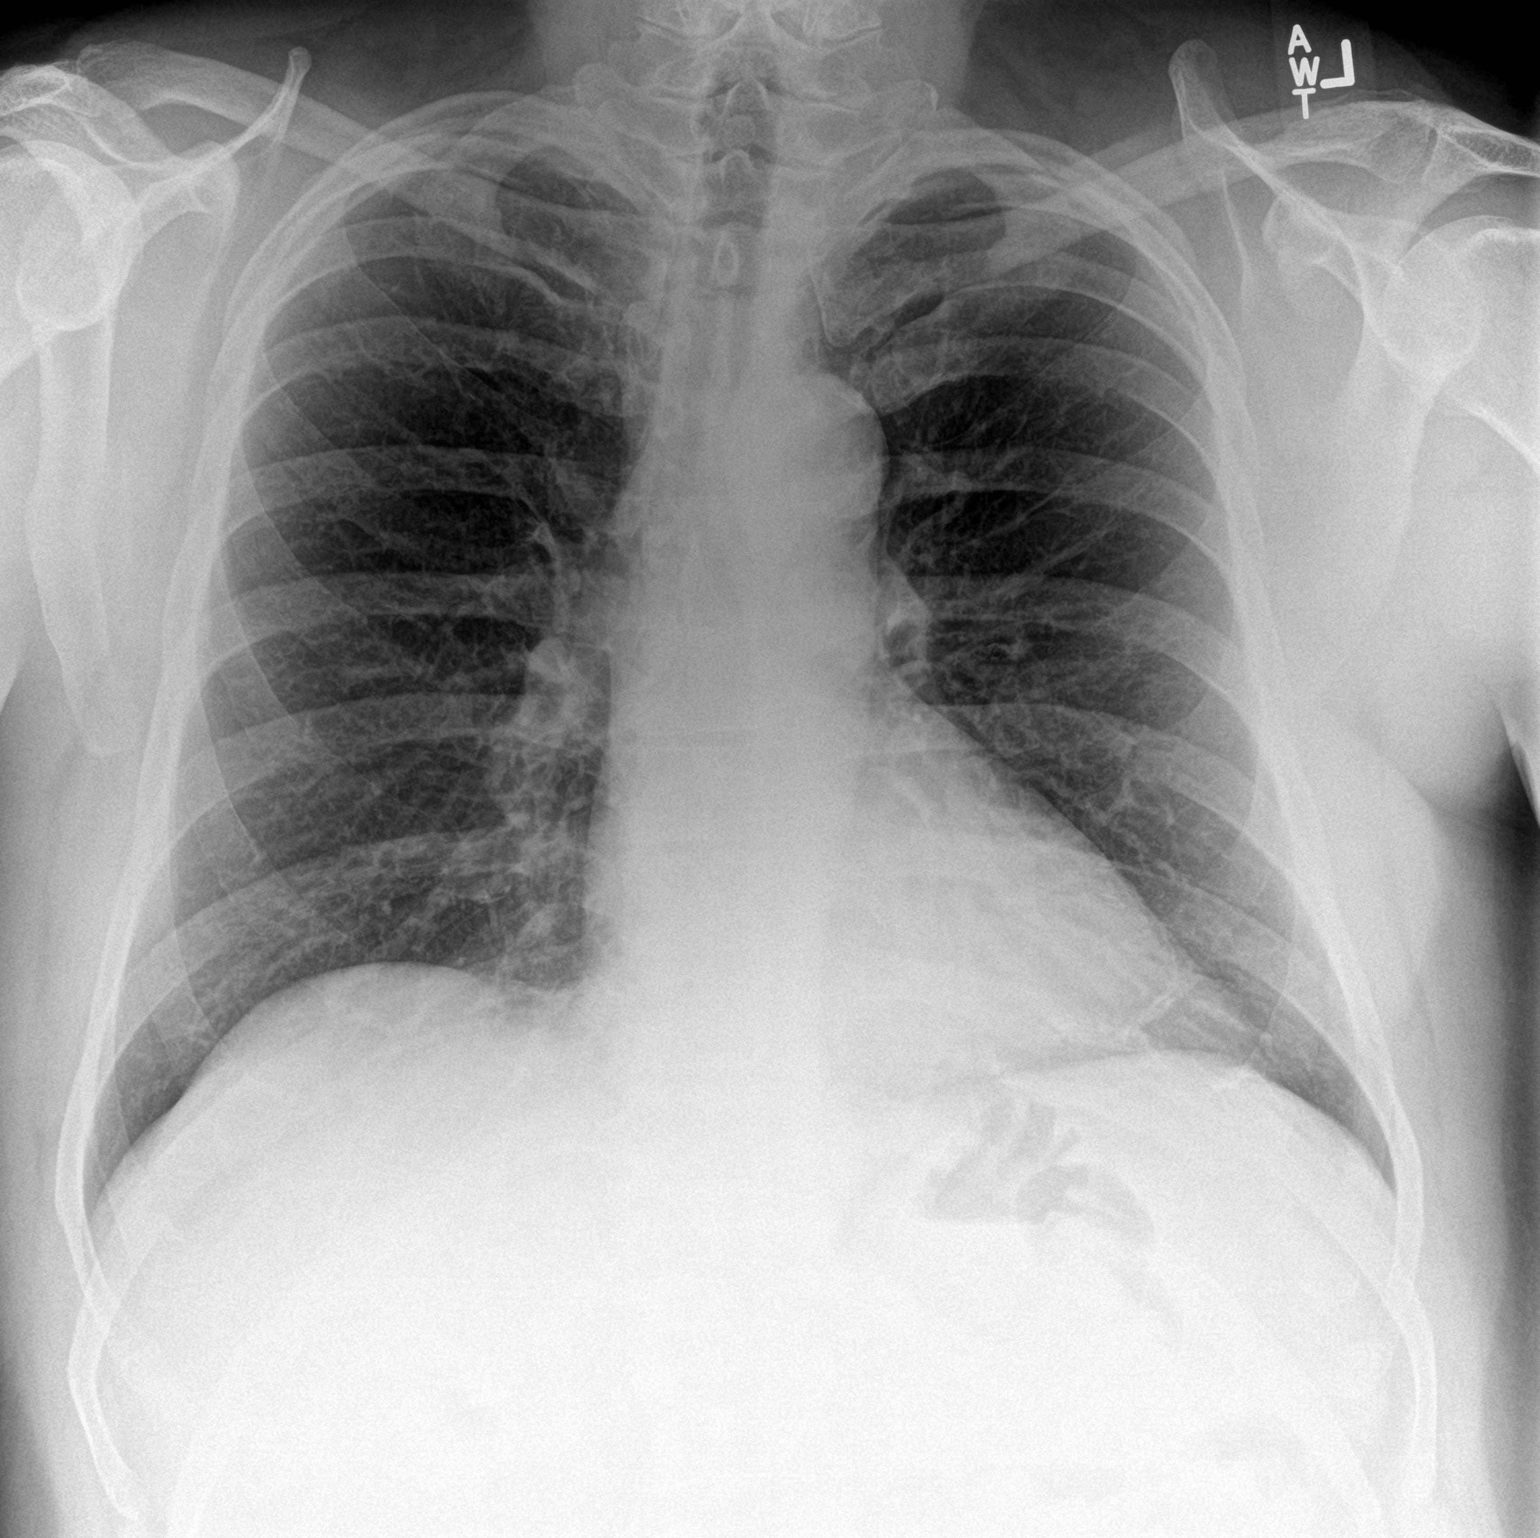

[chest lat]
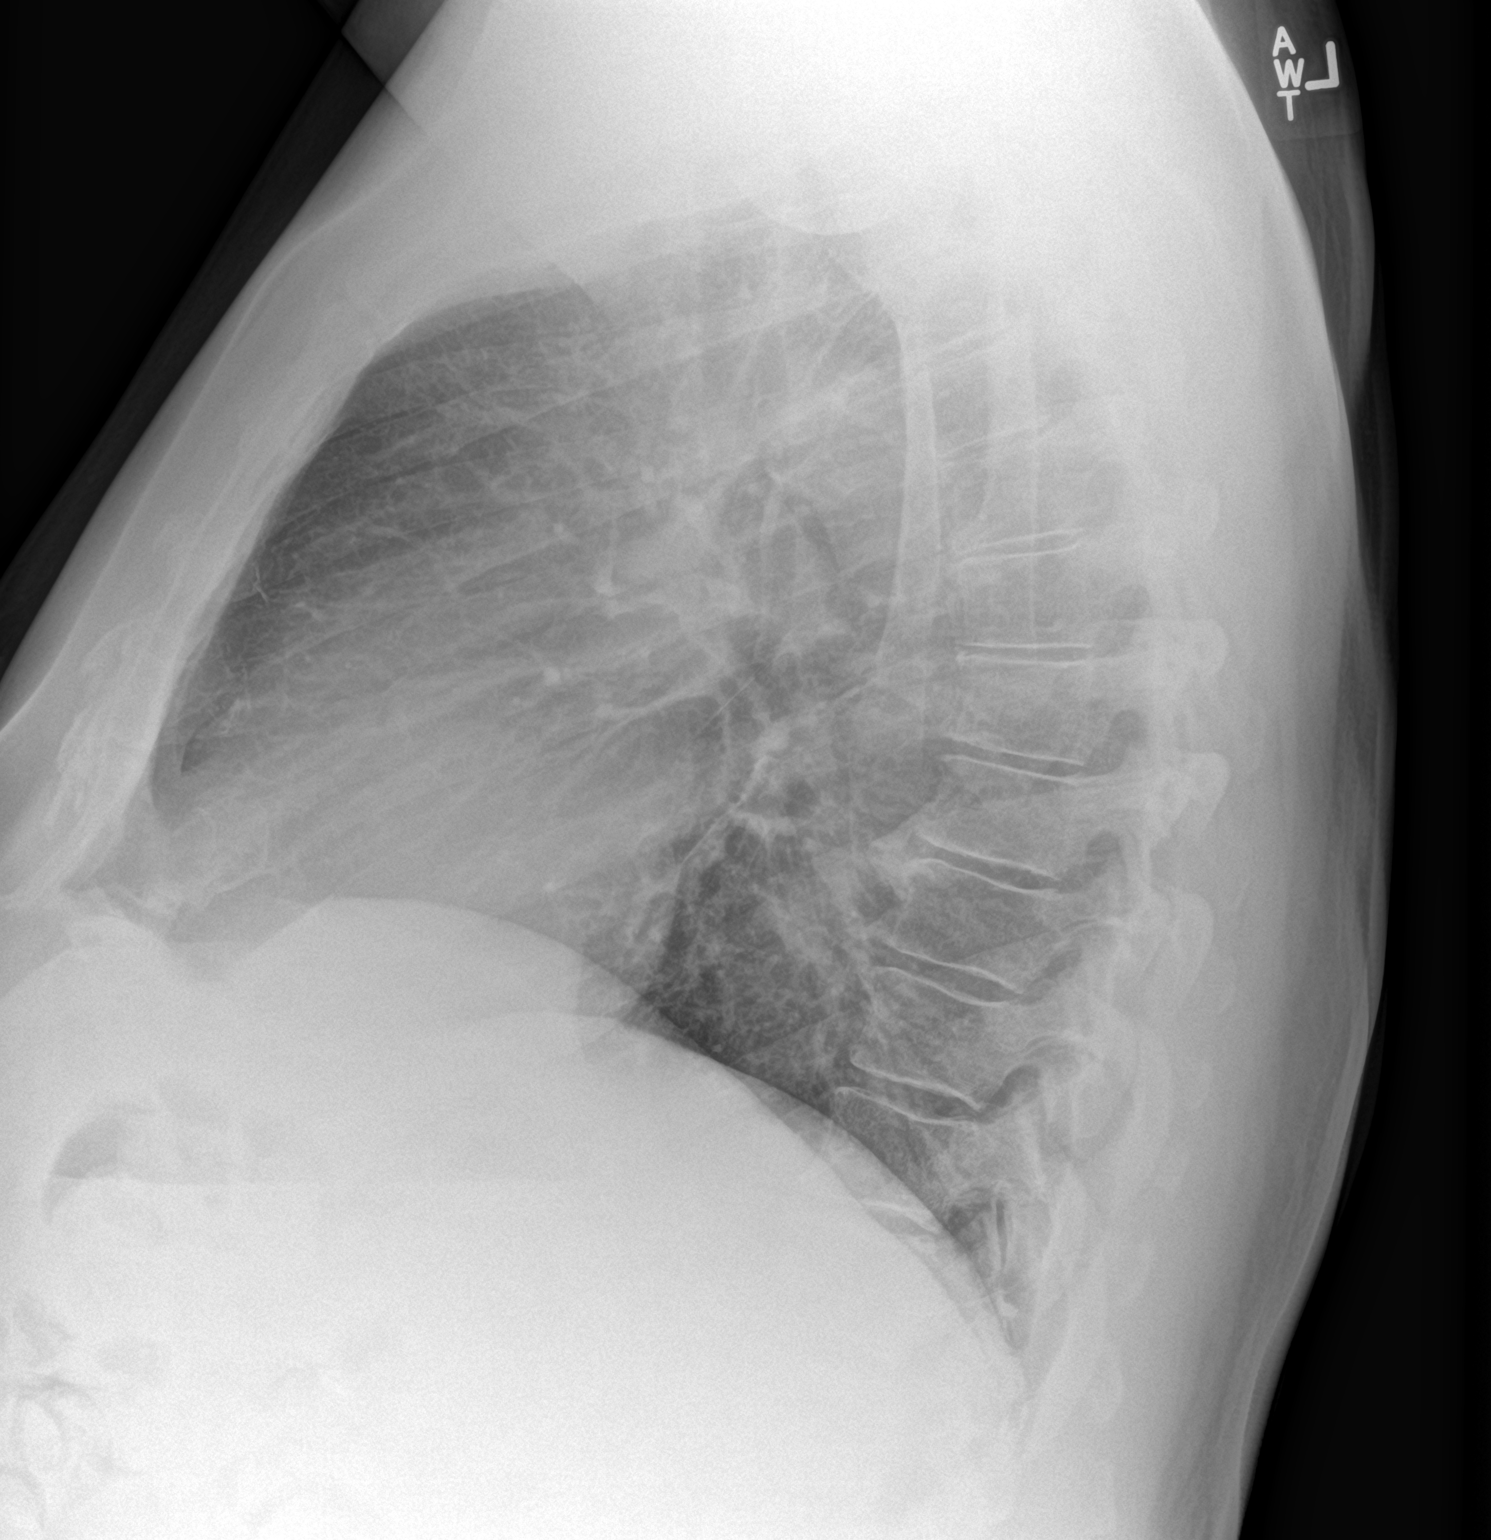

[2 of 2 positions shown; findings below may reference images not displayed]

FINDINGS: Lungs are clear. Heart size and pulmonary vascularity are normal.
There is atherosclerotic calcification in the aortic arch. No
apparent adenopathy. No bone lesions.
IMPRESSION: Aortic atherosclerosis.  No edema or consolidation.

## 2019-06-19 ENCOUNTER — Other Ambulatory Visit: Payer: Medicare HMO

## 2019-06-23 ENCOUNTER — Encounter: Admission: RE | Payer: Self-pay | Source: Home / Self Care

## 2019-06-23 ENCOUNTER — Ambulatory Visit: Admission: RE | Admit: 2019-06-23 | Payer: Medicare HMO | Source: Home / Self Care | Admitting: Internal Medicine

## 2019-06-23 SURGERY — COLONOSCOPY WITH PROPOFOL
Anesthesia: General

## 2021-03-29 DIAGNOSIS — I340 Nonrheumatic mitral (valve) insufficiency: Secondary | ICD-10-CM

## 2021-03-30 DIAGNOSIS — R079 Chest pain, unspecified: Secondary | ICD-10-CM

## 2023-03-12 ENCOUNTER — Encounter: Payer: Self-pay | Admitting: Gastroenterology

## 2024-04-08 ENCOUNTER — Other Ambulatory Visit (HOSPITAL_BASED_OUTPATIENT_CLINIC_OR_DEPARTMENT_OTHER): Payer: Self-pay | Admitting: Family Medicine

## 2024-04-08 ENCOUNTER — Ambulatory Visit (INDEPENDENT_AMBULATORY_CARE_PROVIDER_SITE_OTHER)
Admission: RE | Admit: 2024-04-08 | Discharge: 2024-04-08 | Disposition: A | Source: Ambulatory Visit | Attending: Family Medicine | Admitting: Family Medicine

## 2024-04-08 DIAGNOSIS — J189 Pneumonia, unspecified organism: Secondary | ICD-10-CM
